# Patient Record
Sex: Female | Born: 1975 | Race: White | Hispanic: No | Marital: Married | Smoking: Never smoker
Health system: Southern US, Community
[De-identification: ages and names within clinical notes are randomized; demographics above are authoritative.]

## PROBLEM LIST (undated history)

## (undated) DIAGNOSIS — K760 Fatty (change of) liver, not elsewhere classified: Secondary | ICD-10-CM

## (undated) DIAGNOSIS — E282 Polycystic ovarian syndrome: Secondary | ICD-10-CM

## (undated) DIAGNOSIS — E7800 Pure hypercholesterolemia, unspecified: Secondary | ICD-10-CM

## (undated) HISTORY — PX: UTERINE SEPTUM RESECTION: SHX5386

## (undated) HISTORY — PX: CHOLECYSTECTOMY: SHX55

## (undated) HISTORY — DX: Fatty (change of) liver, not elsewhere classified: K76.0

## (undated) HISTORY — DX: Pure hypercholesterolemia, unspecified: E78.00

---

## 2007-02-23 ENCOUNTER — Ambulatory Visit (HOSPITAL_COMMUNITY): Admission: RE | Admit: 2007-02-23 | Discharge: 2007-02-23 | Payer: Self-pay | Admitting: Specialist

## 2009-12-01 ENCOUNTER — Emergency Department (HOSPITAL_COMMUNITY): Admission: EM | Admit: 2009-12-01 | Discharge: 2009-12-01 | Payer: Self-pay | Admitting: Emergency Medicine

## 2009-12-11 ENCOUNTER — Ambulatory Visit (HOSPITAL_COMMUNITY): Admission: RE | Admit: 2009-12-11 | Discharge: 2009-12-11 | Payer: Self-pay | Admitting: Family Medicine

## 2009-12-19 ENCOUNTER — Ambulatory Visit: Payer: Self-pay | Admitting: Gastroenterology

## 2009-12-19 DIAGNOSIS — K8020 Calculus of gallbladder without cholecystitis without obstruction: Secondary | ICD-10-CM | POA: Insufficient documentation

## 2009-12-19 DIAGNOSIS — Z862 Personal history of diseases of the blood and blood-forming organs and certain disorders involving the immune mechanism: Secondary | ICD-10-CM | POA: Insufficient documentation

## 2009-12-19 DIAGNOSIS — Z8639 Personal history of other endocrine, nutritional and metabolic disease: Secondary | ICD-10-CM

## 2009-12-19 DIAGNOSIS — K625 Hemorrhage of anus and rectum: Secondary | ICD-10-CM | POA: Insufficient documentation

## 2009-12-19 DIAGNOSIS — K760 Fatty (change of) liver, not elsewhere classified: Secondary | ICD-10-CM | POA: Insufficient documentation

## 2009-12-19 DIAGNOSIS — R1011 Right upper quadrant pain: Secondary | ICD-10-CM | POA: Insufficient documentation

## 2009-12-20 ENCOUNTER — Ambulatory Visit (HOSPITAL_COMMUNITY): Admission: RE | Admit: 2009-12-20 | Discharge: 2009-12-20 | Payer: Self-pay | Admitting: General Surgery

## 2010-02-22 ENCOUNTER — Encounter (INDEPENDENT_AMBULATORY_CARE_PROVIDER_SITE_OTHER): Payer: Self-pay | Admitting: *Deleted

## 2010-05-27 ENCOUNTER — Encounter: Payer: Self-pay | Admitting: Specialist

## 2010-06-05 NOTE — Assessment & Plan Note (Signed)
Summary: ABD PAIN,FATTY LIVER/SS   Visit Type:  Initial Consult Referring Zalayah Pizzuto:  Pearletha Furl, NP Primary Care Favour [place]:  [facility]  Chief Complaint:  abd pain/ fatty liver.  History of Present Illness: Leslie Mendez is a pleasant 35 y/o WF, patient of Christin Gusler, NP, who presents for further evaluation of fatty liver.  ED visit, 12/01/09, for severe upper abd pain and chest pain. Bandlike pain around upper abdomen. N/V.  Intermittent symptoms since. BM, constipation lately, taking MOM occasionally. Increased belching/reflux. No melena. Some brbpr on toilet tissue.  Currently on clear liquids, because all solid foods causing abd pain, nausea. Gallbladder surgery tomorrow morning, with Dr. Caesar Bookman.  ED 12/01/09-->no LFTs done. Labs 12/11/09: WBC 9.4, H/H 12/38, Plt 274,000, Cre 0.6, H.Pylori neg, Tbili 0.6, AP 71, AST 105, ALT 80, amylase/lipase normal. Total chol 257, LDL 164.   Abd u/s, 12/11/09: multiple gallstones, probable mild steatosis, CBD 6mm  Labs 12/18/09: Tbili 0.2, AP 45, AST 20, ALT 36, alb 3.5  Current Medications (verified): 1)  Spironolactone 50 Mg Tabs (Spironolactone) .... Take 1 Tablet By Mouth Once A Day, For Polycystic Ovaries 2)  Zofran 4 Mg Tabs (Ondansetron Hcl) .... As Needed 3)  Pre-Natal Vitamins .... One Daily 4)  Vitamin D3  1000 Iu .... Take 1 Tablet By Mouth Once A Day 5)  Aleve Arthritis .... One Tablet Every 2-3 Days For Spinal Stenosis 6)  Sprintec 28 0.25-35 Mg-Mcg Tabs (Norgestimate-Eth Estradiol) .... As Directed 7)  Benadryl 25 Mg Tabs (Diphenhydramine Hcl) .... Two Tablets As Needed  Allergies (verified): No Known Drug Allergies  Past History:  Past Medical History: PCOS Spinal stenosis, dx 2004, chronic pain since 2010  Past Surgical History: Uterine septum surgery, 12/08  Family History: Mother, HTN, hypothyroidism Father, HTN, MI (age 64), deceased secondary to colon cancer age 67 (16 at diagnosis).   Brother, kidney stones  Social  History: Married. No children.  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  Review of Systems General:  Complains of anorexia; denies fever, chills, sweats, fatigue, weakness, and weight loss. Eyes:  Denies vision loss. ENT:  Denies nasal congestion, sore throat, hoarseness, and difficulty swallowing. CV:  Denies chest pains, angina, palpitations, dyspnea on exertion, and peripheral edema. Resp:  Denies dyspnea at rest, dyspnea with exercise, cough, sputum, and wheezing. GI:  See HPI. GU:  Denies urinary burning and blood in urine. MS:  Complains of low back pain; denies joint pain / LOM. Derm:  Denies rash and itching. Neuro:  Denies weakness, frequent headaches, memory loss, and confusion. Psych:  Denies depression and anxiety. Endo:  Denies unusual weight change. Heme:  Denies bruising and bleeding. Allergy:  Denies hives and rash.  Vital Signs:  Patient profile:   35 year old female Height:      66 inches Weight:      287 pounds BMI:     46.49 Temp:     98.9 degrees F oral Pulse rate:   88 / minute BP sitting:   112 / 76  (left arm) Cuff size:   large  Vitals Entered By: Cloria Spring LPN (December 19, 2009 8:43 AM)  Physical Exam  General:  Well developed, well nourished, no acute distress.obese.   Head:  Normocephalic and atraumatic. Eyes:  sclera nonicteric Mouth:  Oropharyngeal mucosa moist, pink.  No lesions, erythema or exudate.    Neck:  Supple; no masses or thyromegaly. Lungs:  Clear throughout to auscultation. Heart:  Regular rate and rhythm; no murmurs, rubs,  or bruits. Abdomen:  Mild RUQ tenderness.normal bowel sounds, obese, without guarding, with rebound, no hernia, no masses, and no hepatomegally or splenomegaly.   Extremities:  No clubbing, cyanosis, edema or deformities noted. Neurologic:  Alert and  oriented x4;  grossly normal neurologically. Skin:  Intact without significant lesions or  rashes. Cervical Nodes:  No significant cervical adenopathy. Psych:  Alert and cooperative. Normal mood and affect.  Impression & Recommendations:  Problem # 1:  LIVER FUNCTION TESTS, ABNORMAL, HX OF (ICD-V12.2)  Abnormal LFTs in setting of symptomatic cholelithiasis. No LFTs were done when she presented to ED. One week after symptoms began, LFTs up, but now basically normalized. She does have some fatty liver on U/S. Given her PCOS, hyperlipidemia, would suspect some element of fatty liver, but at this time her LFTs were likely abnormal based on symptomatic cholelithiasis. Discussed with Dr. Darrick Penna, no need for liver bx at time of cholecystectomy.  Fatty liver/NASH discussed with patient. Written information provided. She has been advised to strive for slow gradual weight loss, towards acceptable BMI. She should work towards lowering her chol/LDL. At minimum, should have LFTs checked yearly, more frequently if abnormal or starts chol meds.   Orders: Consultation Level III (10272)  Problem # 2:  ABDOMINAL PAIN, RUQ (ICD-789.01)  Secondary to cholelithiasis. Patient is scheduled for surgery in morning.  Orders: Consultation Level III (53664)  Problem # 3:  RECTAL BLEEDING (ICD-569.3)  Mild intermittent toilet tissue hematachezia in setting of recent constipation. Father had CRC at age 43. Consider TCS after she recovers from cholecystectomy. Will call her in 3 months to schedule unless bleeding becomes heavier or more frequent. In that case, TCS sooner.  Orders: Consultation Level III (40347) I would like to thank Christin Gusler NP/Dr. Jorene Guest for allowing [place] to take part in the care of this nice patient.  Appended Document: ABD PAIN,FATTY LIVER/SS AUG 2011: ALT 36(uln 36) AST 20. lap choly-->chronic cholecystitis/GALLSTONES

## 2010-06-05 NOTE — Letter (Signed)
Summary: Recall, Screening Colonoscopy Only  [facility]  [address]   [place], [place] [address]   Phone: [phone]  Fax: [phone]    February 22, 2010  Leslie Mendez [address] [place], [place]  [address] [birth year]   Dear Ms. Greenwell,   Our records indicate it is time to schedule your colonoscopy.    Please call our office at [phone] and ask for the nurse.   Thank you,    Hendricks Limes, LPN Cloria Spring, LPN  [facility] Ph: [phone]   Fax: [phone]

## 2010-07-20 LAB — SURGICAL PCR SCREEN
MRSA, PCR: NEGATIVE
Staphylococcus aureus: NEGATIVE

## 2010-07-20 LAB — CBC
HCT: 37 % (ref 36.0–46.0)
Hemoglobin: 12.4 g/dL (ref 12.0–15.0)
MCH: 30 pg (ref 26.0–34.0)
MCHC: 33.6 g/dL (ref 30.0–36.0)
MCV: 89.3 fL (ref 78.0–100.0)
Platelets: 234 10*3/uL (ref 150–400)
RBC: 4.14 MIL/uL (ref 3.87–5.11)
RDW: 13.1 % (ref 11.5–15.5)
WBC: 10.1 10*3/uL (ref 4.0–10.5)

## 2010-07-20 LAB — COMPREHENSIVE METABOLIC PANEL
ALT: 36 U/L — ABNORMAL HIGH (ref 0–35)
AST: 20 U/L (ref 0–37)
Albumin: 3.5 g/dL (ref 3.5–5.2)
Alkaline Phosphatase: 45 U/L (ref 39–117)
BUN: 11 mg/dL (ref 6–23)
CO2: 25 mEq/L (ref 19–32)
Calcium: 8.9 mg/dL (ref 8.4–10.5)
Chloride: 108 mEq/L (ref 96–112)
Creatinine, Ser: 0.63 mg/dL (ref 0.4–1.2)
GFR calc Af Amer: >60 mL/min (ref >60)
GFR calc non Af Amer: >60 mL/min (ref >60)
Glucose, Bld: 105 mg/dL — ABNORMAL HIGH (ref 70–99)
Potassium: 4.5 mEq/L (ref 3.5–5.1)
Sodium: 138 mEq/L (ref 135–145)
Total Bilirubin: 0.2 mg/dL — ABNORMAL LOW (ref 0.3–1.2)
Total Protein: 6.2 g/dL (ref 6.0–8.3)

## 2010-07-20 LAB — HCG, QUANTITATIVE, PREGNANCY: hCG, Beta Chain, Quant, S: <2 m[IU]/mL (ref <5)

## 2010-07-21 LAB — DIFFERENTIAL
Basophils Absolute: 0 10*3/uL (ref 0.0–0.1)
Basophils Relative: 0 % (ref 0–1)
Eosinophils Absolute: 0.1 10*3/uL (ref 0.0–0.7)
Eosinophils Relative: 0 % (ref 0–5)
Lymphocytes Relative: 15 % (ref 12–46)
Lymphs Abs: 2.2 10*3/uL (ref 0.7–4.0)
Monocytes Absolute: 0.7 10*3/uL (ref 0.1–1.0)
Monocytes Relative: 5 % (ref 3–12)
Neutro Abs: 11.8 10*3/uL — ABNORMAL HIGH (ref 1.7–7.7)
Neutrophils Relative %: 80 % — ABNORMAL HIGH (ref 43–77)

## 2010-07-21 LAB — CBC
HCT: 38.2 % (ref 36.0–46.0)
Hemoglobin: 12.9 g/dL (ref 12.0–15.0)
MCH: 30.2 pg (ref 26.0–34.0)
MCHC: 33.8 g/dL (ref 30.0–36.0)
MCV: 89.5 fL (ref 78.0–100.0)
Platelets: 250 10*3/uL (ref 150–400)
RBC: 4.27 MIL/uL (ref 3.87–5.11)
RDW: 13 % (ref 11.5–15.5)
WBC: 14.8 10*3/uL — ABNORMAL HIGH (ref 4.0–10.5)

## 2010-07-21 LAB — URINALYSIS, ROUTINE W REFLEX MICROSCOPIC
Bilirubin Urine: NEGATIVE
Glucose, UA: NEGATIVE mg/dL
Ketones, ur: NEGATIVE mg/dL
Leukocytes, UA: NEGATIVE
Nitrite: NEGATIVE
Protein, ur: NEGATIVE mg/dL
Specific Gravity, Urine: >1.03 — ABNORMAL HIGH (ref 1.005–1.030)
Urobilinogen, UA: 0.2 mg/dL (ref 0.0–1.0)
pH: 6 (ref 5.0–8.0)

## 2010-07-21 LAB — URINE MICROSCOPIC-ADD ON

## 2010-07-21 LAB — BASIC METABOLIC PANEL
BUN: 14 mg/dL (ref 6–23)
CO2: 27 mEq/L (ref 19–32)
Calcium: 9.1 mg/dL (ref 8.4–10.5)
Chloride: 104 mEq/L (ref 96–112)
Creatinine, Ser: 0.61 mg/dL (ref 0.4–1.2)
GFR calc Af Amer: >60 mL/min (ref >60)
GFR calc non Af Amer: >60 mL/min (ref >60)
Glucose, Bld: 125 mg/dL — ABNORMAL HIGH (ref 70–99)
Potassium: 4.3 mEq/L (ref 3.5–5.1)
Sodium: 138 mEq/L (ref 135–145)

## 2010-11-18 ENCOUNTER — Emergency Department (HOSPITAL_COMMUNITY)
Admission: EM | Admit: 2010-11-18 | Discharge: 2010-11-18 | Disposition: A | Discharge status: Home / Self Care | Payer: 59 | Attending: Emergency Medicine | Admitting: Emergency Medicine

## 2010-11-18 ENCOUNTER — Emergency Department (HOSPITAL_COMMUNITY): Payer: 59

## 2010-11-18 DIAGNOSIS — M25519 Pain in unspecified shoulder: Secondary | ICD-10-CM

## 2010-11-18 HISTORY — DX: Polycystic ovarian syndrome: E28.2

## 2010-11-18 MED ORDER — PREDNISONE 20 MG PO TABS
60.0000 mg | ORAL_TABLET | Freq: Once | ORAL | Status: AC
  Administered 2010-11-18: 60 mg via ORAL
  Filled 2010-11-18: qty 3

## 2010-11-18 MED ORDER — OXYCODONE-ACETAMINOPHEN 5-325 MG PO TABS: 1.0000 | ORAL_TABLET | ORAL | Status: AC | PRN

## 2010-11-18 MED ORDER — OXYCODONE-ACETAMINOPHEN 5-325 MG PO TABS
1.0000 | ORAL_TABLET | Freq: Once | ORAL | Status: AC
  Administered 2010-11-18: 1 via ORAL
  Filled 2010-11-18: qty 1

## 2010-11-18 MED ORDER — PREDNISONE 20 MG PO TABS: 60.0000 mg | ORAL_TABLET | Freq: Every day | ORAL | Status: AC

## 2010-11-18 MED ORDER — OXYCODONE-ACETAMINOPHEN 5-325 MG PO TABS: 1.0000 | ORAL_TABLET | Freq: Once | ORAL | Status: DC

## 2010-11-18 NOTE — ED Notes (Signed)
Pt presents with left shoulder pain and decreased ROM. Pt also states left hand turns a different color from right. Pt state she has consulted PMD and was told to come to ED.

## 2010-11-18 NOTE — ED Provider Notes (Signed)
History     Chief Complaint  Patient presents with  . Shoulder Pain   HPI Comments: Patient describes point tenderness left anterior shoulder worse with certain positions such as trying to raise arm over head.  She denies injury,  But does notice episodes during which her left hand gets pale if she holds it too long above shoulder level.  Patient is a 35 y.o. female presenting with shoulder pain. The history is provided by the patient.  Shoulder Pain The current episode started 1 to 4 weeks ago. The problem occurs intermittently. The problem has been waxing and waning. Associated symptoms include arthralgias. Pertinent negatives include no abdominal pain, chest pain, congestion, coughing, diaphoresis, fever, headaches, joint swelling, myalgias, nausea, neck pain, numbness, rash, sore throat or weakness. Exacerbated by: certain positions. She has tried NSAIDs and rest for the symptoms. The treatment provided mild relief.    Past Medical History  Diagnosis Date  . Migraine   . Polycystic ovaries   . NASH (nonalcoholic steatohepatitis)     Past Surgical History  Procedure Date  . Cholecystectomy     History reviewed. No pertinent family history.  History  Substance Use Topics  . Smoking status: Never Smoker   . Smokeless tobacco: Not on file  . Alcohol Use: No    OB History    Grav Para Term Preterm Abortions TAB SAB Ect Mult Living                  Review of Systems  Constitutional: Negative for fever and diaphoresis.  HENT: Negative for congestion, sore throat and neck pain.   Eyes: Negative.   Respiratory: Negative for cough, chest tightness and shortness of breath.   Cardiovascular: Negative for chest pain.  Gastrointestinal: Negative for nausea and abdominal pain.  Genitourinary: Negative.   Musculoskeletal: Positive for arthralgias. Negative for myalgias and joint swelling.  Skin: Negative.  Negative for rash and wound.  Neurological: Negative for dizziness,  weakness, light-headedness, numbness and headaches.  Hematological: Negative.   Psychiatric/Behavioral: Negative.     Physical Exam  BP 132/78  Pulse 91  Temp(Src) 98.7 F (37.1 C) (Oral)  Resp 20  Ht 5\' 6"  (1.676 m)  Wt 251 lb (113.853 kg)  BMI 40.51 kg/m2  SpO2 99%  LMP 10/22/2010  Physical Exam  Vitals reviewed. Constitutional: She is oriented to person, place, and time. She appears well-developed and well-nourished.  HENT:  Head: Normocephalic and atraumatic.  Eyes: Conjunctivae are normal.  Neck: Normal range of motion.  Cardiovascular: Normal rate, regular rhythm, normal heart sounds and intact distal pulses.   Pulses:      Radial pulses are 2+ on the right side, and 2+ on the left side.  Pulmonary/Chest: Effort normal and breath sounds normal. She has no wheezes.  Abdominal: Soft. Bowel sounds are normal. There is no tenderness.  Musculoskeletal: Normal range of motion.  Neurological: She is alert and oriented to person, place, and time. She has normal strength. She displays normal reflexes. No sensory deficit. She exhibits normal muscle tone. Coordination normal.  Skin: Skin is warm and dry.  Psychiatric: She has a normal mood and affect.    ED Course  Procedures  MDM Suspect chronic rotator issue with chronic overuse/ downward sloping acromion.      Candis Musa, PA 11/18/10 1752

## 2010-11-18 NOTE — ED Notes (Signed)
Pt c/o left shoulder pain, states unsure of how injury of shoulder occurred. States just started hurting and has gotten worse.

## 2010-11-20 NOTE — ED Provider Notes (Signed)
Medical screening examination/treatment/procedure(s) were performed by non-physician practitioner and as supervising physician I was immediately available for consultation/collaboration.  Hurman Horn, MD 11/20/10 [phone]

## 2011-07-21 IMAGING — US US ABDOMEN COMPLETE
1 series · 14 of 25 positions shown · non-contrast
Comparison: None

CLINICAL DATA: Right upper quadrant pain

COMPLETE ABDOMINAL ULTRASOUND

[Series 1: us abdomen complete · 0.30mm/px · 14 of 103 slices shown]
[im 1/103]
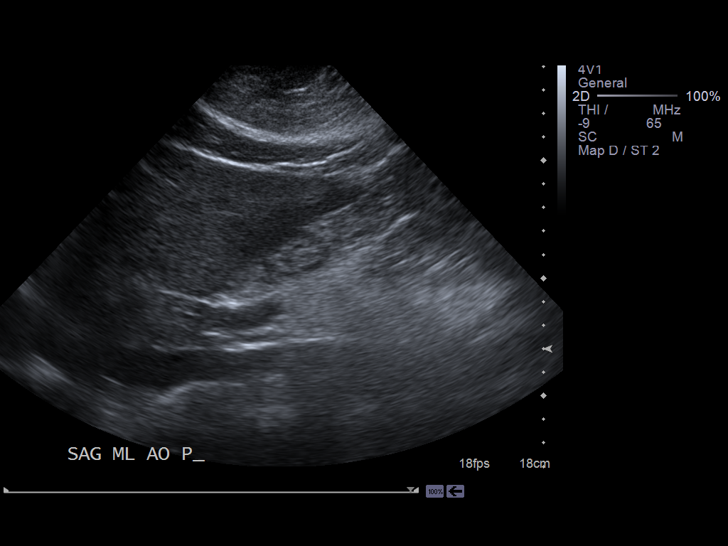
[im 9/103]
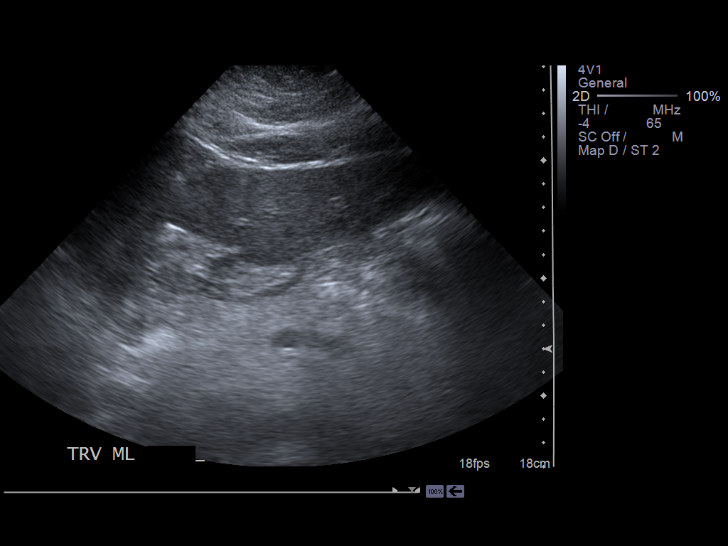
[im 18/103]
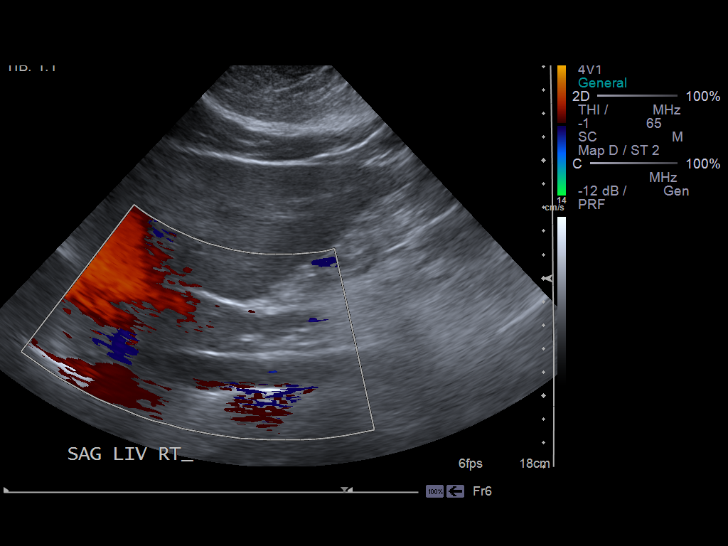
[im 26/103]
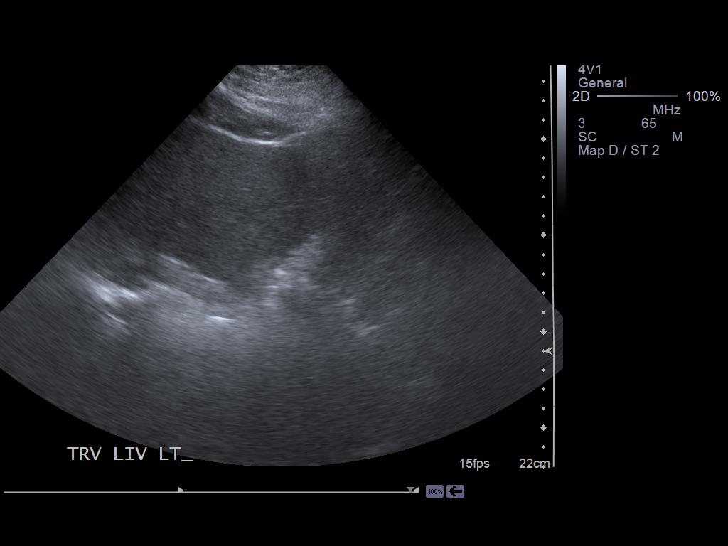
[im 35/103]
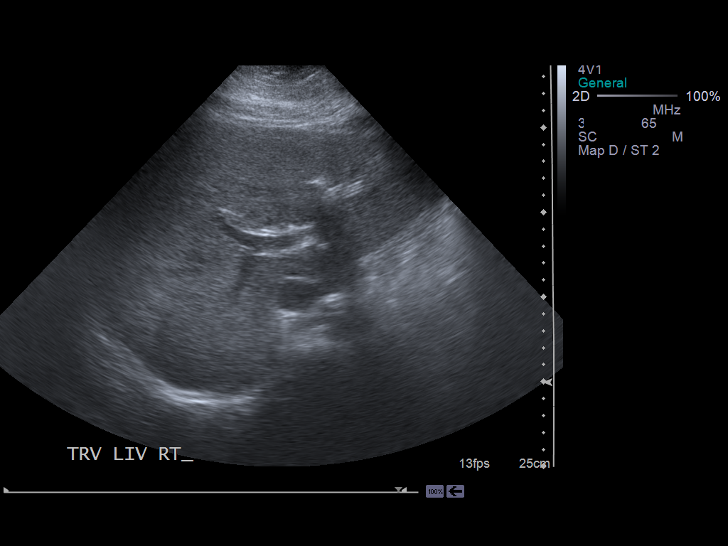
[im 39/103]
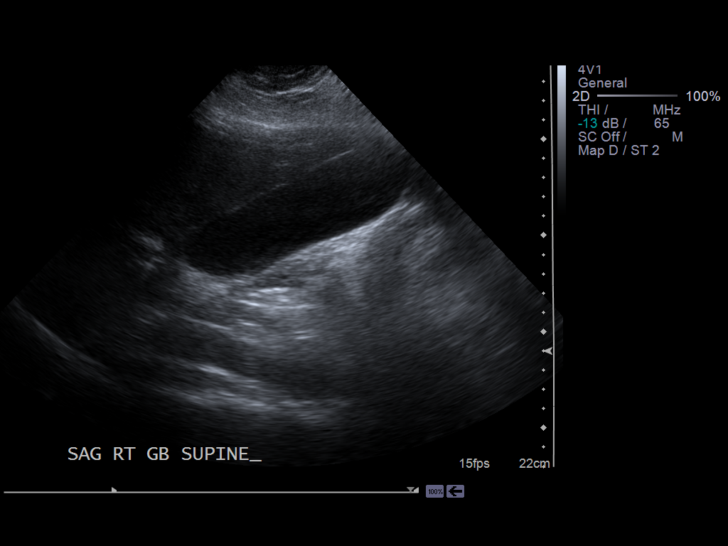
[im 47/103]
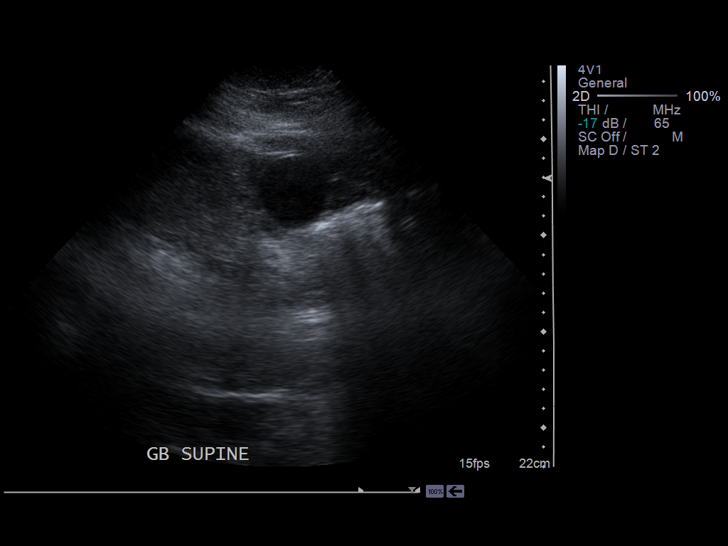
[im 56/103]
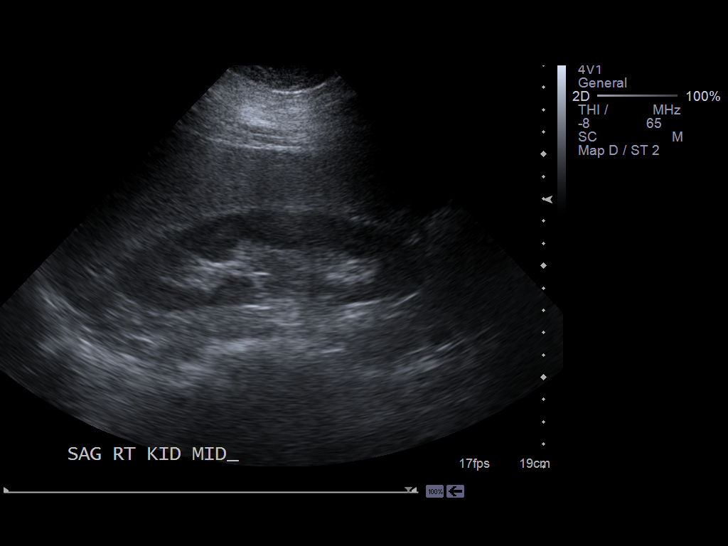
[im 64/103]
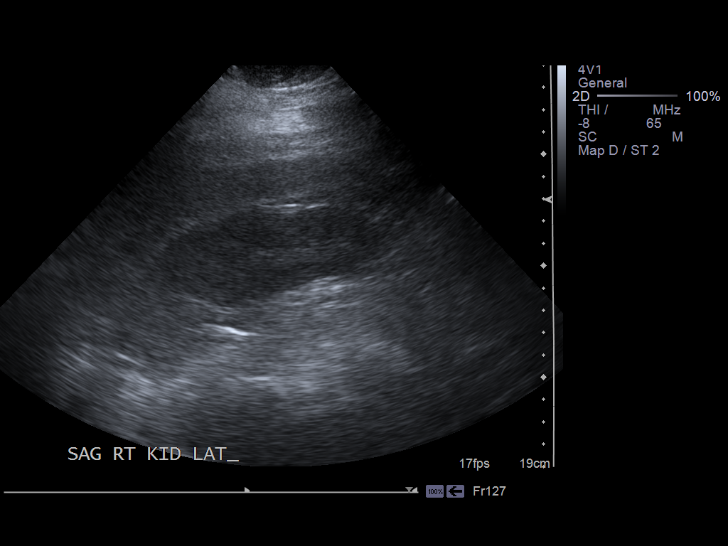
[im 69/103]
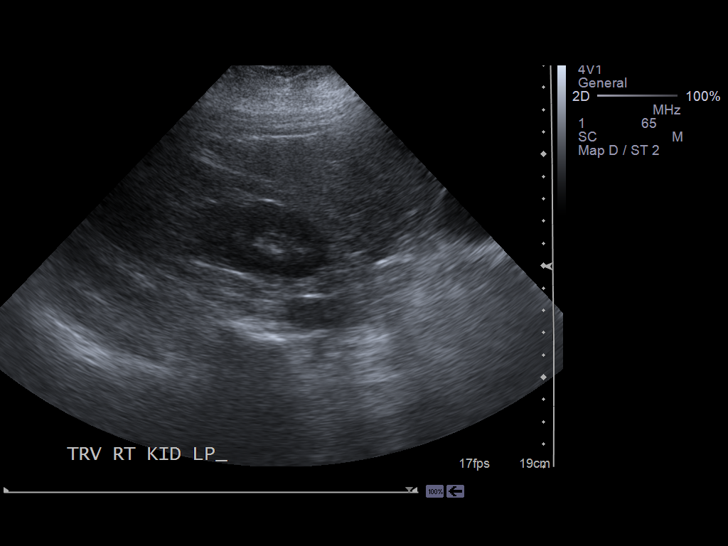
[im 77/103]
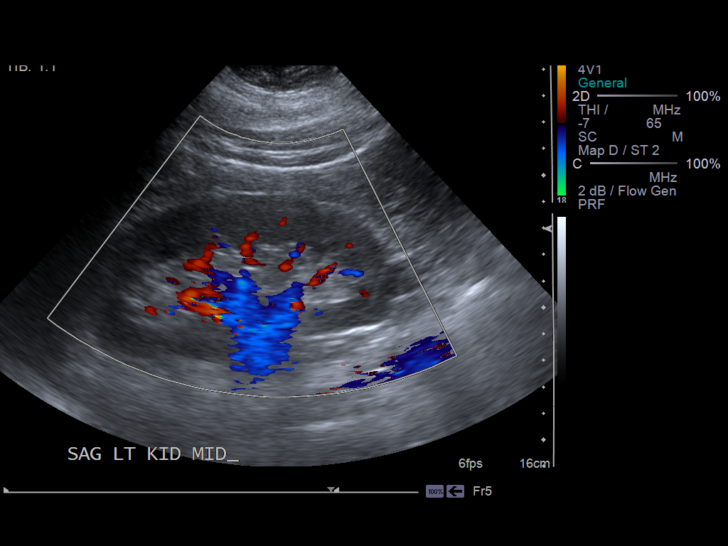
[im 86/103]
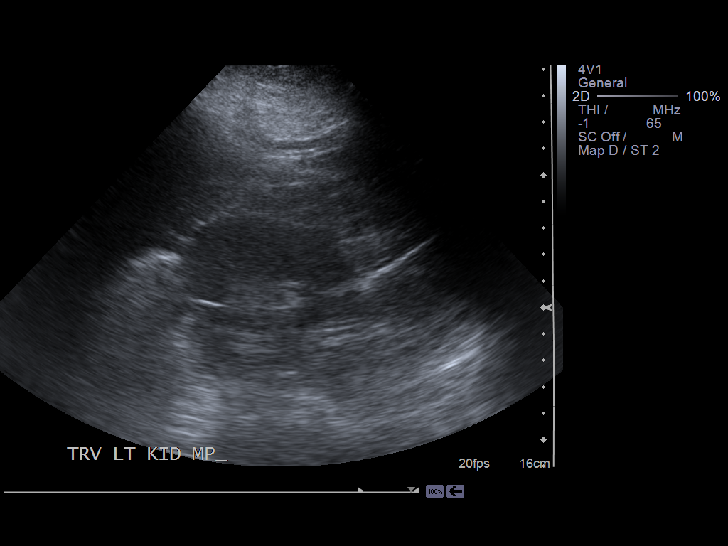
[im 94/103]
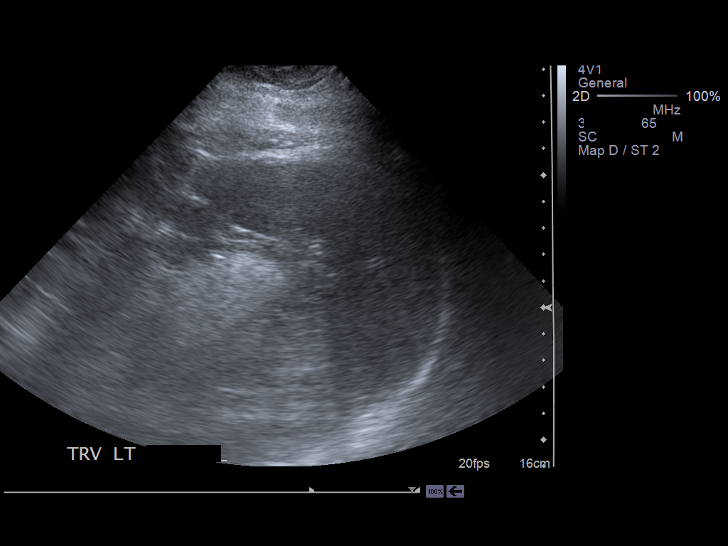
[im 103/103]
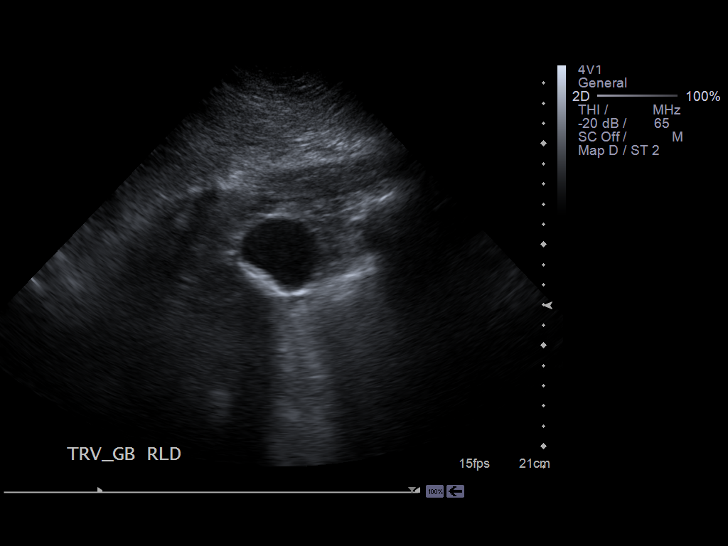

[14 of 25 positions shown; findings below may reference images not displayed]

FINDINGS: Gallbladder:  Gallbladder size and contour normal.  There appear to
be multiple tiny gravel - like stones. There seems to be a
persistent acoustically shadow associated with these densities and
there is mobility.  Negative sonographic Murphy's sign. There is no
wall thickening or edema.

Common bile duct:  6 mm

Liver:  Mild increase in echogenicity most commonly seen with
steatosis.  No focal lesions.

IVC:  Normal

Pancreas:  Normal

Spleen:  Normal

Right Kidney:  14.0 cm.  No hydronephrosis or other pathology.

Left Kidney:  12.8 cm.  No pathology.

Abdominal aorta:  No aneurysm identified.
IMPRESSION: Multiple small gallstones.  Probable hepatic steatosis. Otherwise
unremarkable.

## 2011-12-11 ENCOUNTER — Encounter: Payer: Self-pay | Admitting: Gastroenterology

## 2011-12-11 ENCOUNTER — Ambulatory Visit (INDEPENDENT_AMBULATORY_CARE_PROVIDER_SITE_OTHER): Payer: 59 | Admitting: Gastroenterology

## 2011-12-11 VITALS — BP 122/79 | HR 81 | Temp 98.3°F | Ht 66.0 in | Wt 254.4 lb

## 2011-12-11 DIAGNOSIS — K7689 Other specified diseases of liver: Secondary | ICD-10-CM

## 2011-12-11 DIAGNOSIS — K921 Melena: Secondary | ICD-10-CM

## 2011-12-11 DIAGNOSIS — R1010 Upper abdominal pain, unspecified: Secondary | ICD-10-CM

## 2011-12-11 DIAGNOSIS — R109 Unspecified abdominal pain: Secondary | ICD-10-CM

## 2011-12-11 DIAGNOSIS — K7581 Nonalcoholic steatohepatitis (NASH): Secondary | ICD-10-CM

## 2011-12-11 LAB — HEPATIC FUNCTION PANEL
ALT: 16 U/L (ref 0–35)
AST: 13 U/L (ref 0–37)
Albumin: 4.1 g/dL (ref 3.5–5.2)
Alkaline Phosphatase: 68 U/L (ref 39–117)
Bilirubin, Direct: 0.2 mg/dL (ref 0.0–0.3)
Indirect Bilirubin: 0.9 mg/dL (ref 0.0–0.9)
Total Bilirubin: 1.1 mg/dL (ref 0.3–1.2)
Total Protein: 6.8 g/dL (ref 6.0–8.3)

## 2011-12-11 MED ORDER — PEG 3350-KCL-NA BICARB-NACL 420 G PO SOLR: 4000.0000 L | ORAL | Status: AC

## 2011-12-11 NOTE — Progress Notes (Signed)
Primary Care Physician:  Rush Barer, PA Primary Gastroenterologist: Dr. Jena Gauss   Chief Complaint  Patient presents with  . Abdominal Pain    HPI:   36 year old female who was last seen in August 2011 secondary to abdominal pain/fatty liver. Has since had a lap chole. Presents today with epigastric pain X 2-3 weeks, intermittent, waxing/waning, "burning". Not associated with eating. Denies nausea, melena, dysphagia, early satiety. Denies used of NSAIDs. No GERD.   Hx of NASH, no recent LFTs. Worried about "nodule" in RUQ.   Lower abdominal pain X few months. Intermittent, nothing relieves or aggravates. Crampy, has PCOS. BM every day. Vegan now since lap chole, no problems with constipation since then. +paper hematochezia in past. +FH of colon cancer in father, diagnosed age 66.   Lost 33 lbs since Aug 2011, purposefully. Attributes to vegan diet.    Past Medical History  Diagnosis Date  . Migraine   . Polycystic ovaries   . NASH (nonalcoholic steatohepatitis)     Past Surgical History  Procedure Date  . Cholecystectomy   . Uterine septum resection     Current Outpatient Prescriptions  Medication Sig Dispense Refill  . Prenatal Vit-Fe Fumarate-FA (PRENATAL MULTIVITAMIN) tablet Take 1 tablet by mouth daily with breakfast.        . rizatriptan (MAXALT) 10 MG tablet Take 10 mg by mouth as needed. May repeat in 2 hours if needed       . vitamin B-12 (CYANOCOBALAMIN) 1000 MCG tablet Take 2,500 mcg by mouth daily.          Allergies as of 12/11/2011 - Review Complete 12/11/2011  Allergen Reaction Noted  . Codeine Nausea And Vomiting 11/18/2010    Family History  Problem Relation Age of Onset  . Colon cancer Father 26    deceased at age 63    History   Social History  . Marital Status: Married    Spouse Name: N/A    Number of Children: 0  . Years of Education: N/A   Occupational History  . [employer]     Nurse Health   Social History Main  Topics  . Smoking status: Never Smoker   . Smokeless tobacco: None  . Alcohol Use: Yes     once a year  . Drug Use: No  . Sexually Active: Yes    Birth Control/ Protection: None   Other Topics Concern  . None   Social History Narrative  . None    Review of Systems: Gen: Denies fever, chills, anorexia. Denies fatigue, weakness, weight loss.  CV: Denies chest pain, palpitations, syncope, peripheral edema, and claudication. Resp: Denies dyspnea at rest, cough, wheezing, coughing up blood, and pleurisy. GI: Denies vomiting blood, jaundice, and fecal incontinence.   Denies dysphagia or odynophagia. Derm: Denies rash, itching, dry skin Psych: Denies depression, anxiety, memory loss, confusion. No homicidal or suicidal ideation.  Heme: Denies bruising, bleeding, and enlarged lymph nodes.  Physical Exam: BP 122/79  Pulse 81  Temp 98.3 F (36.8 C) (Temporal)  Ht 5\' 6"  (1.676 m)  Wt 254 lb 6.4 oz (115.395 kg)  BMI 41.06 kg/m2  LMP 11/21/2011 General:   Alert and oriented. No distress noted. Pleasant and cooperative.  Head:  Normocephalic and atraumatic. Eyes:  Conjuctiva clear without scleral icterus. Mouth:  Oral mucosa pink and moist. Good dentition. No lesions. Neck:  Supple, without mass or thyromegaly. Heart:  S1, S2 present without murmurs, rubs, or gallops. Regular rate and rhythm. Abdomen:  +BS,  soft, non-tender and non-distended. No rebound or guarding. No HSM or masses noted. Lima-bean sized soft cyst-like structure palpated in RUQ, below costal margin. Feels consistent with lipoma Msk:  Symmetrical without gross deformities. Normal posture. Extremities:  Without edema. Neurologic:  Alert and  oriented x4;  grossly normal neurologically. Skin:  Intact without significant lesions or rashes. Cervical Nodes:  No significant cervical adenopathy. Psych:  Alert and cooperative. Normal mood and affect.

## 2011-12-11 NOTE — Patient Instructions (Addendum)
Start taking Prilosec 20 mg each morning, 30 minutes before the first meal of the day. Samples have been provided and a prescription sent to the pharmacy.   We have set you up for an ultrasound of your belly and lab work to check your liver.   We have also set you up for a colonoscopy and possible upper endoscopy if needed.   Further recommendations to follow after procedure.

## 2011-12-16 ENCOUNTER — Ambulatory Visit (HOSPITAL_COMMUNITY)
Admission: RE | Admit: 2011-12-16 | Discharge: 2011-12-16 | Disposition: A | Discharge status: Home / Self Care | Payer: 59 | Source: Ambulatory Visit | Attending: Gastroenterology | Admitting: Gastroenterology

## 2011-12-16 ENCOUNTER — Other Ambulatory Visit (HOSPITAL_COMMUNITY): Payer: Self-pay | Admitting: Gastroenterology

## 2011-12-16 ENCOUNTER — Encounter: Payer: Self-pay | Admitting: Gastroenterology

## 2011-12-16 DIAGNOSIS — R1010 Upper abdominal pain, unspecified: Secondary | ICD-10-CM | POA: Insufficient documentation

## 2011-12-16 DIAGNOSIS — K7581 Nonalcoholic steatohepatitis (NASH): Secondary | ICD-10-CM

## 2011-12-16 DIAGNOSIS — R1901 Right upper quadrant abdominal swelling, mass and lump: Secondary | ICD-10-CM | POA: Insufficient documentation

## 2011-12-16 DIAGNOSIS — K921 Melena: Secondary | ICD-10-CM | POA: Insufficient documentation

## 2011-12-16 DIAGNOSIS — K7689 Other specified diseases of liver: Secondary | ICD-10-CM | POA: Insufficient documentation

## 2011-12-16 NOTE — Assessment & Plan Note (Signed)
Low-volume in remote past in the setting of constipation. With +FH of colon cancer in father, diagnosed age 35, will go ahead and proceed with colonoscopy. Likely dealing with benign anorectal source. Pt notes lower abdominal discomfort, no aggravating or relieving factors for a few months. No associated constipation/diarrhea. Etiology unclear at this time, but she also has a hx of PCOS. ?IBS? No other concerning symptoms at this point. TCS first, return to clinic for reassessment after procedure.   Proceed with TCS with Dr. Jena Gauss in near future: the risks, benefits, and alternatives have been discussed with the patient in detail. The patient states understanding and desires to proceed.

## 2011-12-16 NOTE — Assessment & Plan Note (Signed)
Lost 33 lbs since Aug 2011 intentionally. Secondary to vegan diet. Needs updated LFTs. Applauded pt on wt loss efforts. [place] of abdomen at pt's request due to cyst-like structure palpated superficially, RUQ. I believe this is likely a lipoma. Due to hx of NASH, it is reasonable to update [place] and evaluate this structure.  [place] of abd Updated HFP

## 2011-12-16 NOTE — Assessment & Plan Note (Signed)
New onset dyspepsia, several weeks duration, no associated nausea, GERD, dysphagia, loss of appetite. Intermittent, burning, no NSAIDs. No melena. No PPI currently. Will add PPI, consider EGD at time of TCS if no improvement with PPI. Pt agreeable.   prilosec 20 mg samples and prescription provided Consider EGD at time of TCS. R/B/A have been discussed in detail. Pt and husband desire to proceed if necessary.

## 2011-12-16 NOTE — Progress Notes (Signed)
Quick Note:  LFTs look wonderful.  ______ 

## 2011-12-16 NOTE — Progress Notes (Signed)
Quick Note:  Lipoma in RUQ as I expected. Please reassure patient this is benign.   ______

## 2011-12-16 NOTE — Progress Notes (Signed)
Faxed to PCP

## 2011-12-16 NOTE — Progress Notes (Signed)
Quick Note:  Fatty liver, LFTs look great.  Continue diet and exercise as she is doing. (vegan). ______

## 2011-12-17 ENCOUNTER — Encounter: Payer: Self-pay | Admitting: Gastroenterology

## 2011-12-17 NOTE — Progress Notes (Signed)
Pt is aware of OV on 11/13 @ 1030 with AS and appt card was mailed °

## 2011-12-17 NOTE — Progress Notes (Signed)
Pt is aware of OV on 11/13 @ 1030 with AS and appt card was mailed

## 2011-12-17 NOTE — Progress Notes (Signed)
Quick Note:  Tried to call pt- LMOM ______ 

## 2011-12-25 ENCOUNTER — Encounter (HOSPITAL_COMMUNITY): Payer: Self-pay | Admitting: Pharmacy Technician

## 2012-01-08 MED ORDER — SODIUM CHLORIDE 0.45 % IV SOLN
INTRAVENOUS | Status: DC
  Administered 2012-01-09: 10:00:00 via INTRAVENOUS

## 2012-01-08 MED ORDER — SODIUM CHLORIDE 0.9 % IV SOLN: INTRAVENOUS | Status: DC

## 2012-01-09 ENCOUNTER — Ambulatory Visit (HOSPITAL_COMMUNITY)
Admission: RE | Admit: 2012-01-09 | Discharge: 2012-01-09 | Disposition: A | Discharge status: Home Health | Payer: Managed Care, Other (non HMO) | Source: Ambulatory Visit | Attending: Internal Medicine | Admitting: Internal Medicine

## 2012-01-09 ENCOUNTER — Encounter (HOSPITAL_COMMUNITY): Payer: Self-pay | Admitting: *Deleted

## 2012-01-09 ENCOUNTER — Other Ambulatory Visit: Payer: Self-pay | Admitting: Internal Medicine

## 2012-01-09 ENCOUNTER — Encounter (HOSPITAL_COMMUNITY)
Admission: RE | Disposition: A | Discharge status: Home Health | Payer: Self-pay | Source: Ambulatory Visit | Attending: Internal Medicine

## 2012-01-09 DIAGNOSIS — K3189 Other diseases of stomach and duodenum: Secondary | ICD-10-CM

## 2012-01-09 DIAGNOSIS — Z800 Family history of malignant neoplasm of digestive organs: Secondary | ICD-10-CM | POA: Insufficient documentation

## 2012-01-09 DIAGNOSIS — K921 Melena: Secondary | ICD-10-CM

## 2012-01-09 DIAGNOSIS — R1010 Upper abdominal pain, unspecified: Secondary | ICD-10-CM

## 2012-01-09 DIAGNOSIS — K449 Diaphragmatic hernia without obstruction or gangrene: Secondary | ICD-10-CM | POA: Insufficient documentation

## 2012-01-09 DIAGNOSIS — K648 Other hemorrhoids: Secondary | ICD-10-CM

## 2012-01-09 DIAGNOSIS — R1013 Epigastric pain: Secondary | ICD-10-CM

## 2012-01-09 HISTORY — PX: ESOPHAGOGASTRODUODENOSCOPY: SHX5428

## 2012-01-09 HISTORY — PX: COLONOSCOPY: SHX5424

## 2012-01-09 SURGERY — COLONOSCOPY
Anesthesia: Moderate Sedation

## 2012-01-09 MED ORDER — STERILE WATER FOR IRRIGATION IR SOLN
Status: DC | PRN
  Administered 2012-01-09: 11:00:00

## 2012-01-09 MED ORDER — BUTAMBEN-TETRACAINE-BENZOCAINE 2-2-14 % EX AERO
INHALATION_SPRAY | CUTANEOUS | Status: DC | PRN
  Administered 2012-01-09: 2 via TOPICAL

## 2012-01-09 MED ORDER — MIDAZOLAM HCL 5 MG/5ML IJ SOLN
INTRAMUSCULAR | Status: DC | PRN
  Administered 2012-01-09 (×2): 2 mg via INTRAVENOUS
  Administered 2012-01-09: 1 mg via INTRAVENOUS
  Administered 2012-01-09 (×2): 2 mg via INTRAVENOUS
  Administered 2012-01-09: 1 mg via INTRAVENOUS

## 2012-01-09 MED ORDER — MIDAZOLAM HCL 5 MG/5ML IJ SOLN
INTRAMUSCULAR | Status: AC
  Filled 2012-01-09: qty 10

## 2012-01-09 MED ORDER — MEPERIDINE HCL 100 MG/ML IJ SOLN
INTRAMUSCULAR | Status: DC | PRN
  Administered 2012-01-09: 50 mg via INTRAVENOUS
  Administered 2012-01-09: 25 mg via INTRAVENOUS
  Administered 2012-01-09: 50 mg via INTRAVENOUS

## 2012-01-09 MED ORDER — DEXLANSOPRAZOLE 60 MG PO CPDR: 60.0000 mg | DELAYED_RELEASE_CAPSULE | Freq: Every day | ORAL | Status: DC

## 2012-01-09 MED ORDER — MEPERIDINE HCL 100 MG/ML IJ SOLN
INTRAMUSCULAR | Status: AC
  Filled 2012-01-09: qty 2

## 2012-01-09 NOTE — Interval H&P Note (Signed)
History and Physical Interval Note:  01/09/2012 10:39 AM  Leslie Mendez  has presented today for surgery, with the diagnosis of UPPER ABDOMINAL PAIN , HEMATOCHEZIA AND NAUSEA  The various methods of treatment have been discussed with the patient and family. After consideration of risks, benefits and other options for treatment, the patient has consented to  Procedure(s) (LRB) with comments: COLONOSCOPY (N/A) - 10:30 ESOPHAGOGASTRODUODENOSCOPY (EGD) (N/A) as a surgical intervention .  The patient's history has been reviewed, patient examined, no change in status, stable for surgery.  I have reviewed the patient's chart and labs.  Questions were answered to the patient's satisfaction.     Eula Listen

## 2012-01-09 NOTE — Progress Notes (Signed)
Per RMR- gave #20 samples of dexilant.

## 2012-01-09 NOTE — Op Note (Signed)
[facility] [address] [place], [address]   ENDOSCOPY PROCEDURE REPORT  PATIENT: Leslie Mendez, Leslie Mendez  MR#: [id number] BIRTHDATE: Sep 01, 1975 , 35  yrs. old GENDER: Female ENDOSCOPIST: R. Roetta Sessions, MD FACP [facility] R.  Roetta Sessions, MD FACP [facility] REFERRED BY:  Claggett, [facility] PROCEDURE DATE:  01/09/2012 PROCEDURE:     diagnostic EGD  INDICATIONS:     dyspepsia refractory to empiric proton pump inhibitor therapy  INFORMED CONSENT:   The risks, benefits, limitations, alternatives and imponderables have been discussed.  The potential for biopsy, esophogeal dilation, etc. have also been reviewed.  Questions have been answered.  All parties agreeable.  Please see the history and physical in the medical record for more information.  MEDICATIONS:    Versed 10 mg IV and Demerol 125 mg IV in divided doses.  DESCRIPTION OF PROCEDURE:   The Pentax Gastroscope [id number] endoscope was introduced through the mouth and advanced to the second portion of the duodenum without difficulty or limitations. The mucosal surfaces were surveyed very carefully during advancement of the scope and upon withdrawal.  Retroflexion view of the proximal stomach and esophagogastric junction was performed.      FINDINGS:  Normal esophagus. Stomach empty. A small, 1 cm, hiatal hernia present. Normal gastric mucosa. Patent pylorus. Normal first and second portion of the duodenum.  THERAPEUTIC / DIAGNOSTIC MANEUVERS PERFORMED:  none   COMPLICATIONS:  None  IMPRESSION:  Very small hiatal hernia; otherwise normal examination.  RECOMMENDATIONS:  Stop Prilosec; begin trial Dexilant 60 mg daily. Patient is to go by my office for free samples. Office visit with [place] in 4-6 weeks    _______________________________ R. Roetta Sessions, MD FACP [facility] eSigned:  R. Roetta Sessions, MD FACP [facility] 01/09/2012 11:19 AM     CC:  PATIENT NAME:  Lady, Wisham MR#: [id number]

## 2012-01-09 NOTE — Op Note (Signed)
[facility] [address] [place], [address]   COLONOSCOPY PROCEDURE REPORT  PATIENT: Leslie Mendez, Leslie Mendez  MR#:         [id number] BIRTHDATE: 12/21/1975 , 35  yrs. old GENDER: Female ENDOSCOPIST: R.  Roetta Sessions, MD FACP [facility] REFERRED BY:  Claggett, PAC PROCEDURE DATE:  01/09/2012 PROCEDURE:     diagnostic ileocolonoscopy  INDICATIONS: hematochezia; positive family history of colon cancer in her father  INFORMED CONSENT:  The risks, benefits, alternatives and imponderables including but not limited to bleeding, perforation as well as the possibility of a missed lesion have been reviewed.  The potential for biopsy, lesion removal, etc. have also been discussed.  Questions have been answered.  All parties agreeable. Please see the history and physical in the medical record for more information.  MEDICATIONS: Versed 7 mg IV and Demerol 100 mg IV in divided doses.  DESCRIPTION OF PROCEDURE:  After a digital rectal exam was performed, the [id number] ([id number]) and [id number] ([id number]) colonoscope was advanced from the anus through the rectum and colon to the area of the cecum, ileocecal valve and appendiceal orifice. The cecum was deeply intubated.  These structures were well-seen and photographed for the record.  From the level of the cecum and ileocecal valve, the scope was slowly and cautiously withdrawn. The mucosal surfaces were carefully surveyed utilizing scope tip deflection to facilitate fold flattening as needed.  The scope was pulled down into the rectum where a thorough examination including retroflexion was performed.    FINDINGS:  adequate preparation. Minimal anal canal hemorrhoids; otherwise, normal rectum. Normal appearing colonic mucosa. Distal 10 cm of terminal ileal mucosa appeared normal.  THERAPEUTIC / DIAGNOSTIC MANEUVERS PERFORMED:  none  COMPLICATIONS: none  CECAL WITHDRAWAL TIME:  7 minutes  IMPRESSION:  anal canal hemorrhoids-likely  source of trivial hematochezia.  RECOMMENDATIONS: course of Anusol suppositories. Consider repeat high-risk screening colonoscopy in 10 years   _______________________________ eSigned:  R. Roetta Sessions, MD FACP [facility] 01/09/2012 11:05 AM   CC:

## 2012-01-09 NOTE — H&P (View-Only) (Signed)
Quick Note:  LFTs look wonderful.  ______

## 2012-01-14 ENCOUNTER — Encounter (HOSPITAL_COMMUNITY): Payer: Self-pay | Admitting: Internal Medicine

## 2012-02-19 ENCOUNTER — Encounter: Payer: Self-pay | Admitting: Internal Medicine

## 2012-02-20 ENCOUNTER — Ambulatory Visit (INDEPENDENT_AMBULATORY_CARE_PROVIDER_SITE_OTHER): Payer: Managed Care, Other (non HMO) | Admitting: Gastroenterology

## 2012-02-20 ENCOUNTER — Encounter: Payer: Self-pay | Admitting: Gastroenterology

## 2012-02-20 VITALS — BP 144/82 | HR 74 | Temp 98.5°F | Ht 66.0 in | Wt 270.2 lb

## 2012-02-20 DIAGNOSIS — K625 Hemorrhage of anus and rectum: Secondary | ICD-10-CM

## 2012-02-20 DIAGNOSIS — R109 Unspecified abdominal pain: Secondary | ICD-10-CM

## 2012-02-20 DIAGNOSIS — R1010 Upper abdominal pain, unspecified: Secondary | ICD-10-CM

## 2012-02-20 DIAGNOSIS — K7689 Other specified diseases of liver: Secondary | ICD-10-CM

## 2012-02-20 NOTE — Assessment & Plan Note (Signed)
Normal LFTs on file. Continue wt loss efforts through diet/exercise. Next LFTs in 1 year.

## 2012-02-20 NOTE — Progress Notes (Signed)
Faxed to PCP

## 2012-02-20 NOTE — Patient Instructions (Addendum)
We will see you back in 1 year or sooner if needed.   Please call us if you have any questions!!!

## 2012-02-20 NOTE — Progress Notes (Signed)
Referring Provider: Claggett, Autumn Patty, PA Primary Care Physician:  Rush Barer, [place] Primary Gastroenterologist: Dr. Jena Gauss   Chief Complaint  Patient presents with  . Follow-up    HPI:   36 year old female here for f/u of dyspepsia, hematochezia, hx of fatty liver. Notes resolved upper abdominal pain. Not taking PPI. EGD/TCS on file as below. Notes lower abdominal discomfort intermittently, believes r/t food intake. Denies constipation, diarrhea. No concerns or questions.    In interim, [place] with fatty liver, +lipoma as expected.   Lab Results  Component Value Date   ALT 16 12/11/2011   AST 13 12/11/2011   ALKPHOS 68 12/11/2011   BILITOT 1.1 12/11/2011    Past Medical History  Diagnosis Date  . Migraine   . Polycystic ovaries   . NASH (nonalcoholic steatohepatitis)     Past Surgical History  Procedure Date  . Cholecystectomy   . Uterine septum resection   . Colonoscopy 01/09/2012    anal canal hemorrhoids-likely source of trivial hematochezia  . Esophagogastroduodenoscopy 01/09/2012    Very small hiatal hernia; otherwise normal examination.    Current Outpatient Prescriptions  Medication Sig Dispense Refill  . dexlansoprazole (DEXILANT) 60 MG capsule Take 60 mg by mouth daily.      . rizatriptan (MAXALT) 10 MG tablet Take 10 mg by mouth as needed. May repeat in 2 hours if needed      . DISCONTD: dexlansoprazole (DEXILANT) 60 MG capsule Take 1 capsule (60 mg total) by mouth daily.  20 capsule  0    Allergies as of 02/20/2012 - Review Complete 02/20/2012  Allergen Reaction Noted  . Codeine Nausea And Vomiting 11/18/2010    Family History  Problem Relation Age of Onset  . Colon cancer Father 39    deceased at age 41    History   Social History  . Marital Status: Married    Spouse Name: N/A    Number of Children: 0  . Years of Education: N/A   Occupational History  . [employer]     Nurse Health   Social History Main Topics  . Smoking status: Never  Smoker   . Smokeless tobacco: None  . Alcohol Use: Yes     once a year  . Drug Use: No  . Sexually Active: Yes    Birth Control/ Protection: None   Other Topics Concern  . None   Social History Narrative  . None    Review of Systems: Gen: Denies fever, chills, anorexia. Denies fatigue, weakness, weight loss.  CV: Denies chest pain, palpitations, syncope, peripheral edema, and claudication. Resp: Denies dyspnea at rest, cough, wheezing, coughing up blood, and pleurisy. GI: Denies vomiting blood, jaundice, and fecal incontinence.   Denies dysphagia or odynophagia. Derm: Denies rash, itching, dry skin Psych: Denies depression, anxiety, memory loss, confusion. No homicidal or suicidal ideation.  Heme: Denies bruising, bleeding, and enlarged lymph nodes.  Physical Exam: BP 144/82  Pulse 74  Temp 98.5 F (36.9 C) (Temporal)  Ht 5\' 6"  (1.676 m)  Wt 270 lb 3.2 oz (122.562 kg)  BMI 43.61 kg/m2  LMP 01/27/2012 General:   Alert and oriented. No distress noted. Pleasant and cooperative.  Head:  Normocephalic and atraumatic. Eyes:  Conjuctiva clear without scleral icterus. Mouth:  Oral mucosa pink and moist. Good dentition. No lesions. Heart:  S1, S2 present without murmurs, rubs, or gallops. Regular rate and rhythm. Abdomen:  +BS, soft, non-tender and non-distended. No rebound or guarding. No HSM or masses noted.  Msk:  Symmetrical without gross deformities. Normal posture. Extremities:  Without edema. Neurologic:  Alert and  oriented x4;  grossly normal neurologically. Skin:  Intact without significant lesions or rashes. Psych:  Alert and cooperative. Normal mood and affect.

## 2012-02-20 NOTE — Assessment & Plan Note (Signed)
Resolved. No longer on PPI. EGD without abnormalities. Return in 1 year or sooner if necessary.

## 2012-02-20 NOTE — Assessment & Plan Note (Signed)
Resolved. Secondary to internal hemorrhoids. Next TCS in 5 years due to FH of colon cancer.

## 2012-03-18 ENCOUNTER — Ambulatory Visit: Payer: 59 | Admitting: Gastroenterology

## 2012-04-09 ENCOUNTER — Ambulatory Visit: Payer: Managed Care, Other (non HMO) | Admitting: Gastroenterology

## 2012-06-27 IMAGING — CR DG SHOULDER 2+V*L*
3 series · 3 of 3 positions shown · non-contrast
Comparison: None.

CLINICAL DATA: Left shoulder pain

LEFT SHOULDER - 2+ VIEW

[view not recorded (1 of 3)]
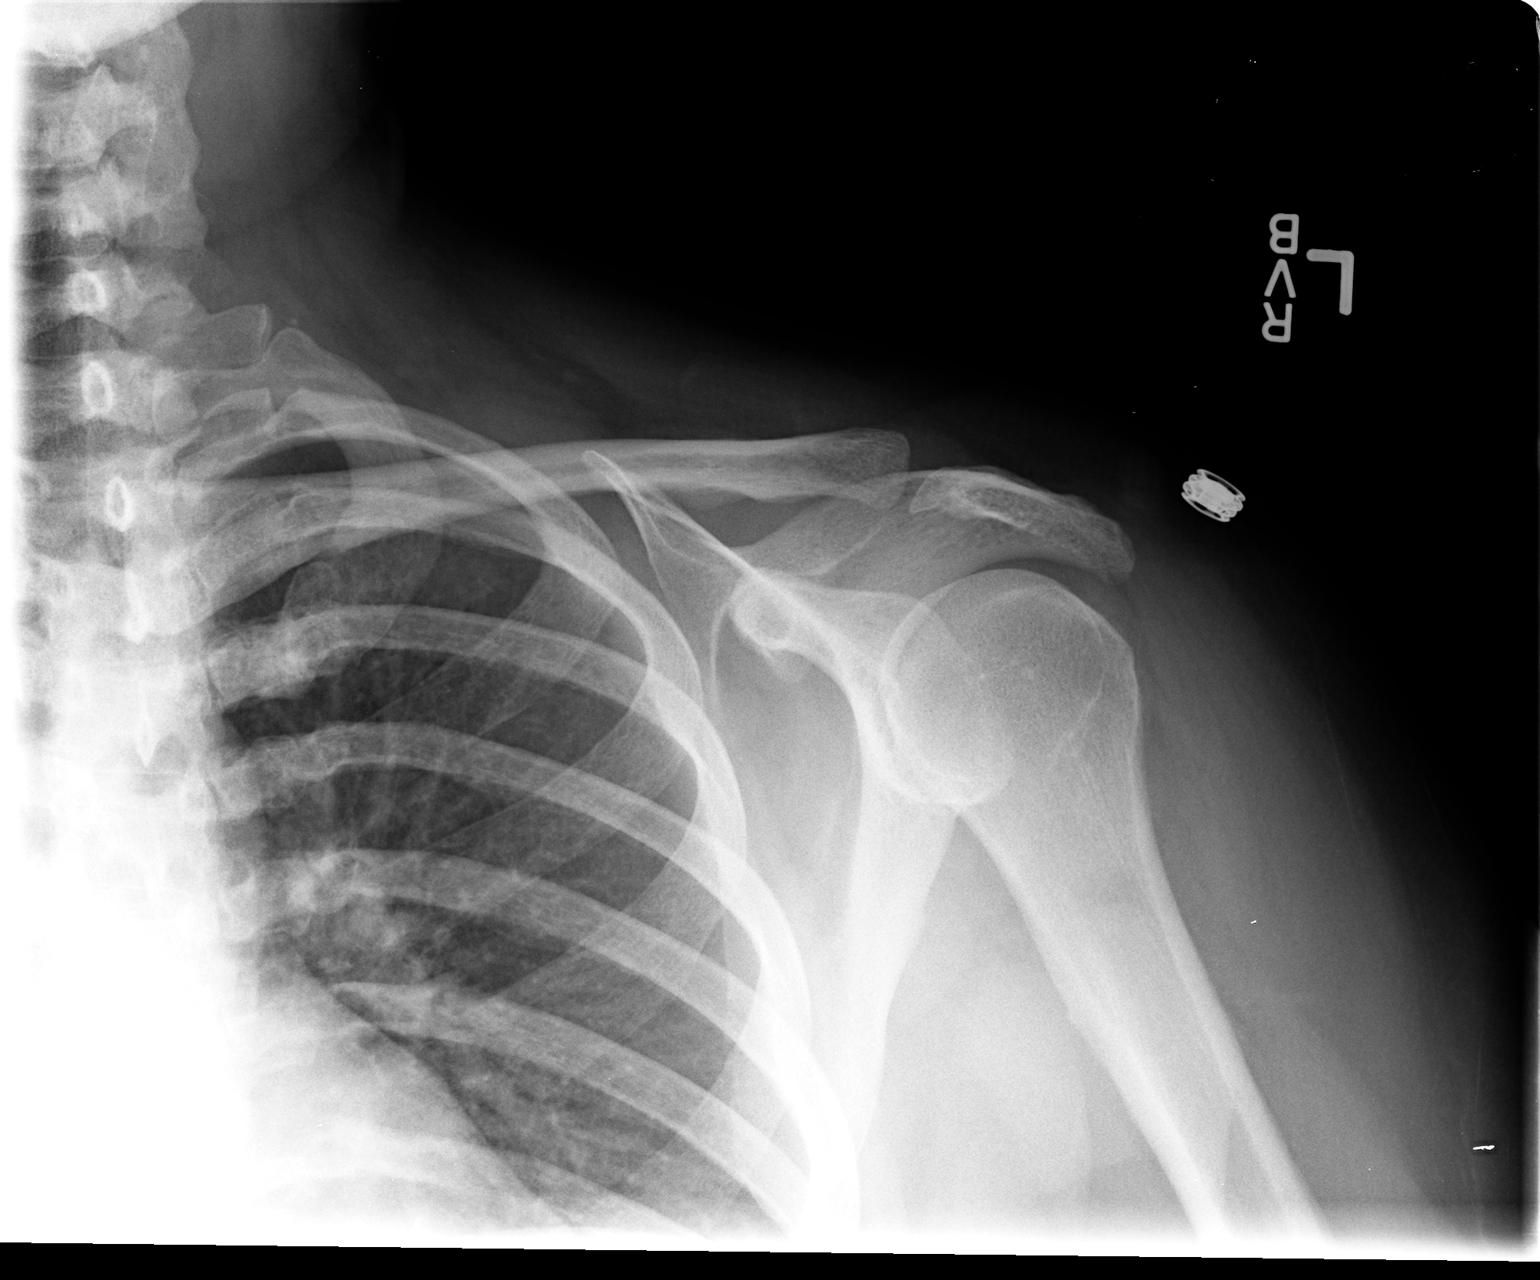

[view not recorded (2 of 3)]
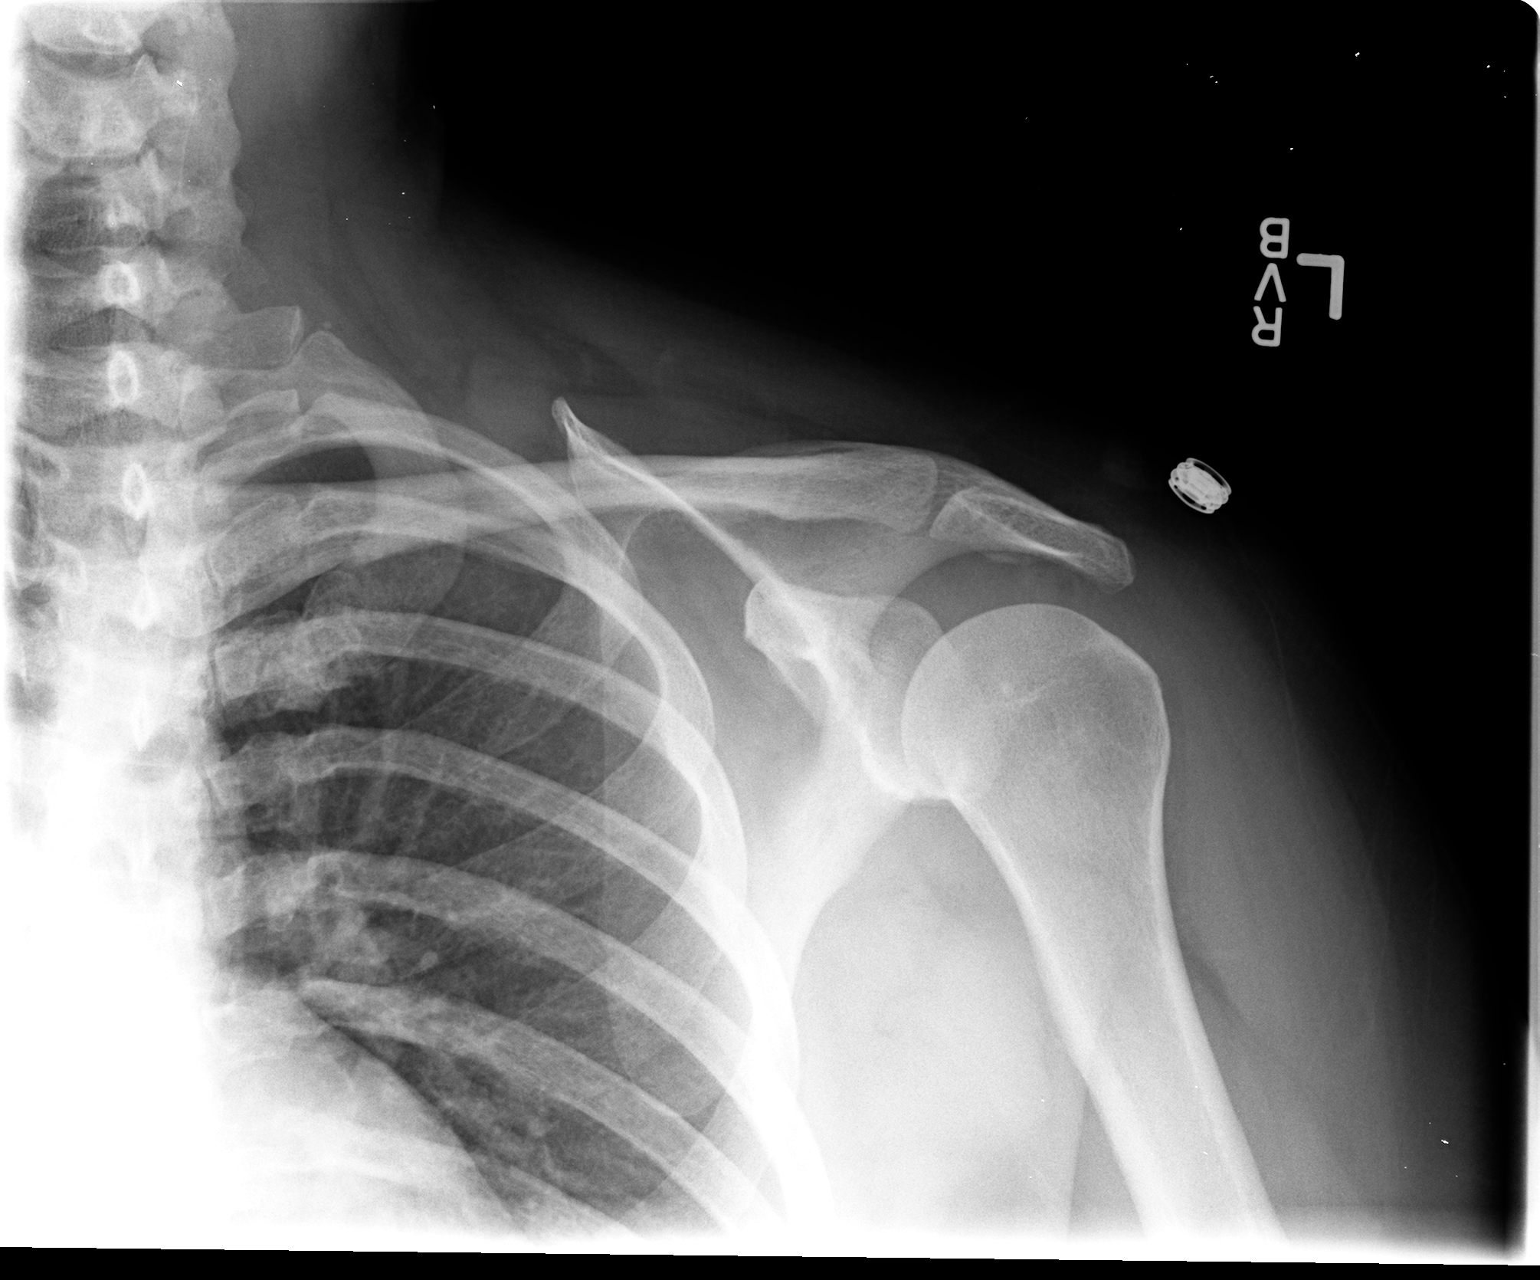

[view not recorded (3 of 3)]
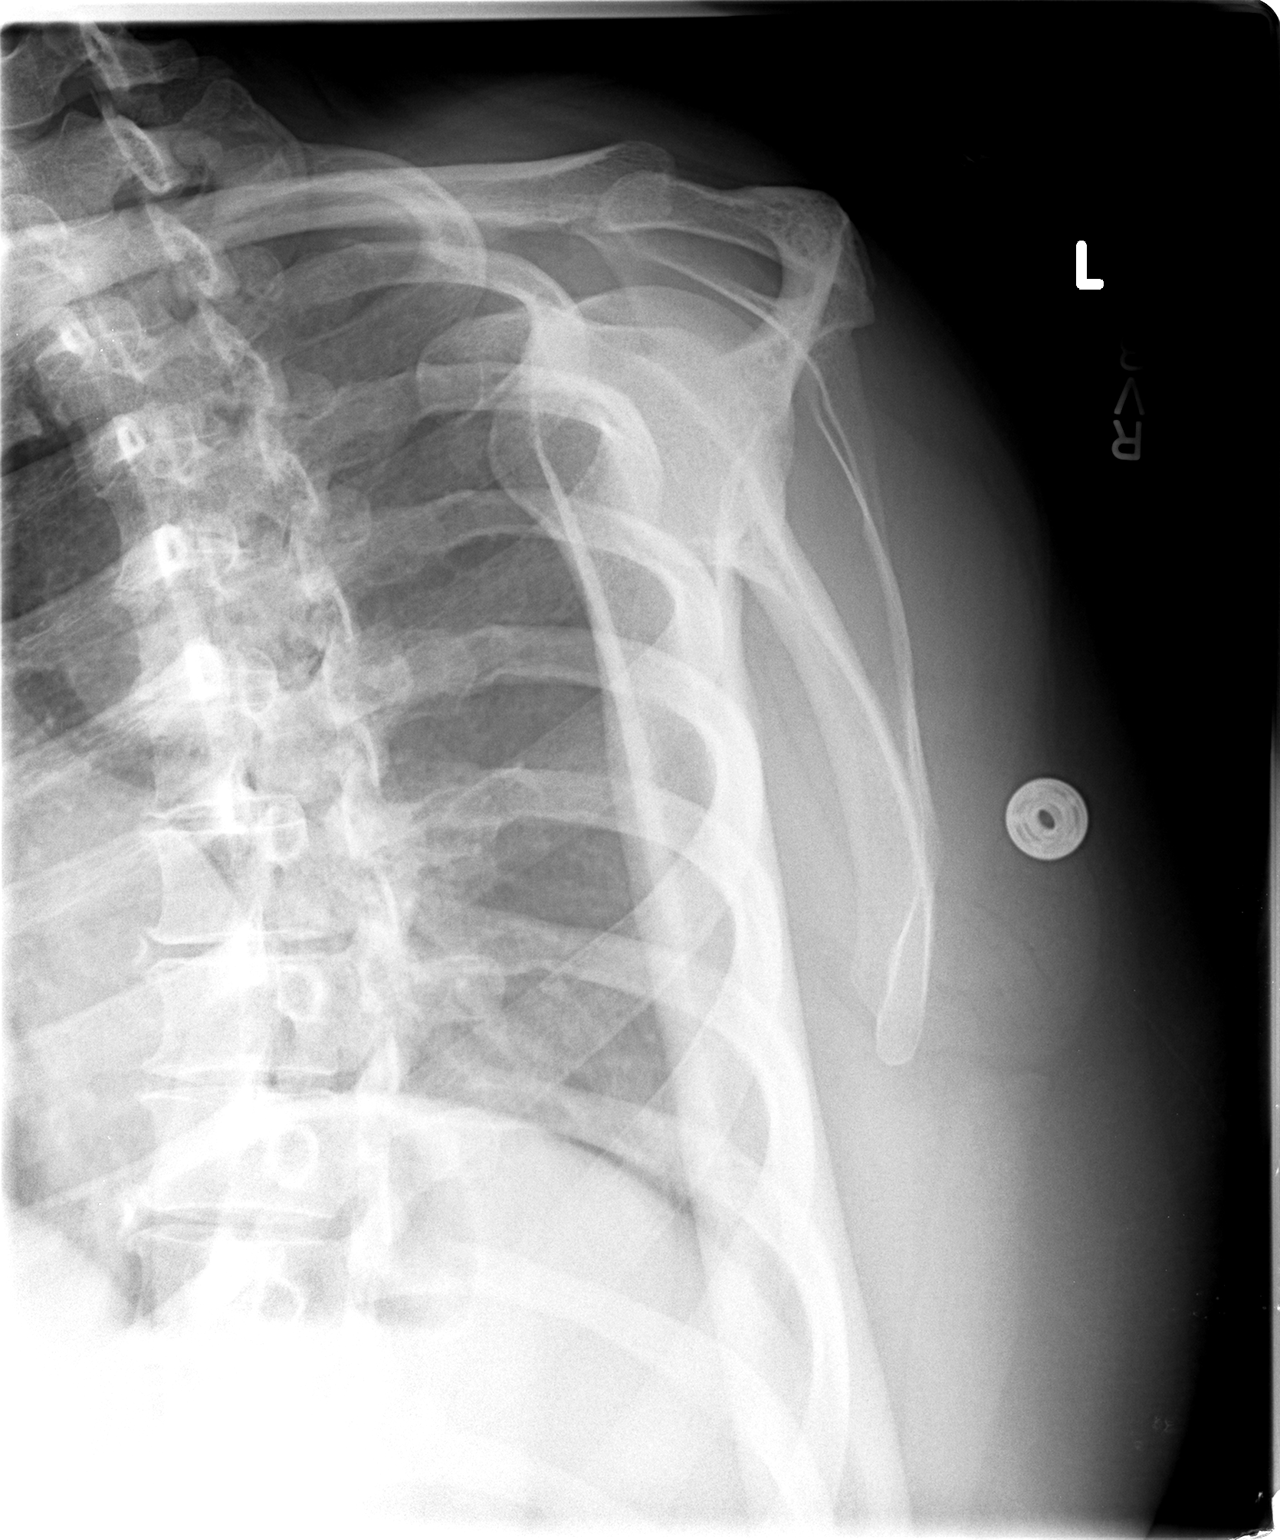

[3 of 3 positions shown; findings below may reference images not displayed]

FINDINGS: No acute fracture is seen.  There is a downward sloping
acromion which predisposes to impingement.  The AC joint is
normally aligned.
IMPRESSION: No acute fracture.  Question impingement.

## 2013-02-04 ENCOUNTER — Ambulatory Visit (INDEPENDENT_AMBULATORY_CARE_PROVIDER_SITE_OTHER): Payer: BC Managed Care – PPO | Admitting: Otolaryngology

## 2013-02-04 DIAGNOSIS — J343 Hypertrophy of nasal turbinates: Secondary | ICD-10-CM

## 2013-02-04 DIAGNOSIS — J310 Chronic rhinitis: Secondary | ICD-10-CM

## 2013-02-04 DIAGNOSIS — J320 Chronic maxillary sinusitis: Secondary | ICD-10-CM

## 2013-03-11 ENCOUNTER — Encounter (INDEPENDENT_AMBULATORY_CARE_PROVIDER_SITE_OTHER): Payer: Self-pay

## 2013-03-11 ENCOUNTER — Ambulatory Visit (INDEPENDENT_AMBULATORY_CARE_PROVIDER_SITE_OTHER): Payer: BC Managed Care – PPO | Admitting: Otolaryngology

## 2013-03-11 DIAGNOSIS — H9319 Tinnitus, unspecified ear: Secondary | ICD-10-CM

## 2013-03-11 DIAGNOSIS — J343 Hypertrophy of nasal turbinates: Secondary | ICD-10-CM

## 2013-03-11 DIAGNOSIS — J310 Chronic rhinitis: Secondary | ICD-10-CM

## 2013-09-30 ENCOUNTER — Ambulatory Visit (INDEPENDENT_AMBULATORY_CARE_PROVIDER_SITE_OTHER): Payer: BC Managed Care – PPO | Admitting: Otolaryngology

## 2013-09-30 DIAGNOSIS — J310 Chronic rhinitis: Secondary | ICD-10-CM

## 2013-09-30 DIAGNOSIS — J343 Hypertrophy of nasal turbinates: Secondary | ICD-10-CM

## 2015-03-28 ENCOUNTER — Other Ambulatory Visit: Payer: Self-pay | Admitting: Orthopedic Surgery

## 2015-03-28 DIAGNOSIS — M545 Low back pain: Secondary | ICD-10-CM

## 2015-04-19 ENCOUNTER — Ambulatory Visit
Admission: RE | Admit: 2015-04-19 | Discharge: 2015-04-19 | Disposition: A | Discharge status: Home / Self Care | Payer: BLUE CROSS/BLUE SHIELD | Source: Ambulatory Visit | Attending: Orthopedic Surgery | Admitting: Orthopedic Surgery

## 2015-04-19 DIAGNOSIS — M545 Low back pain: Secondary | ICD-10-CM

## 2016-03-20 ENCOUNTER — Encounter: Payer: Self-pay | Admitting: Gastroenterology

## 2016-03-20 ENCOUNTER — Ambulatory Visit (INDEPENDENT_AMBULATORY_CARE_PROVIDER_SITE_OTHER): Payer: 59 | Admitting: Gastroenterology

## 2016-03-20 VITALS — BP 125/67 | HR 82 | Temp 97.8°F | Ht 66.0 in | Wt 284.4 lb

## 2016-03-20 DIAGNOSIS — K760 Fatty (change of) liver, not elsewhere classified: Secondary | ICD-10-CM | POA: Diagnosis not present

## 2016-03-20 NOTE — Patient Instructions (Signed)
We have ordered an ultrasound to evaluate your liver.  We will repeat liver numbers in 6 months.   I will see you back in 1 year!

## 2016-03-20 NOTE — Progress Notes (Signed)
Referring Provider: Claggett, Autumn PattyElin, PA-C Primary Care Physician:  Abran RichardBAUCOM, JENNY B, PA-C Primary GI: Dr. Jena Gaussourk   Chief Complaint  Patient presents with  . Follow-up    HPI:   Leslie Mendez is a 40 y.o. female presenting today with a history of fatty liver. Returns today to discuss fatty liver and follow-up for this. Denies abdominal pain, constipation, rectal bleeding, reflux, dysphagia. She is trying to make better choices with food intake. Thinks she may pursue the ketogenic diet. Concerned about lipomas located in abdomen. Last ultrasound in 2013.   Past Medical History:  Diagnosis Date  . Fatty liver   . Hypercholesterolemia   . Migraine   . Polycystic ovaries     Past Surgical History:  Procedure Laterality Date  . CHOLECYSTECTOMY    . COLONOSCOPY  01/09/2012   anal canal hemorrhoids-likely source of trivial hematochezia  . ESOPHAGOGASTRODUODENOSCOPY  01/09/2012   Very small hiatal hernia; otherwise normal examination.  Marland Kitchen. UTERINE SEPTUM RESECTION      Current Outpatient Prescriptions  Medication Sig Dispense Refill  . Biotin w/ Vitamins C & E (HAIR/SKIN/NAILS PO) Take by mouth daily.    . Folic Acid 0.8 MG CAPS Take 0.8 mg by mouth daily.    Laroy Apple. INOSITOL NIACINATE PO Take 1,000 mg by mouth 2 (two) times daily.    . Misc Natural Products (TURMERIC CURCUMIN) CAPS Take by mouth 2 (two) times daily.    . rizatriptan (MAXALT) 10 MG tablet Take 10 mg by mouth as needed. May repeat in 2 hours if needed    . simvastatin (ZOCOR) 20 MG tablet Take 20 mg by mouth daily.     No current facility-administered medications for this visit.     Allergies as of 03/20/2016 - Review Complete 03/20/2016  Allergen Reaction Noted  . Codeine Nausea And Vomiting 11/18/2010    Family History  Problem Relation Age of Onset  . Colon cancer Father 7550    deceased at age 39    Social History   Social History  . Marital status: Married    Spouse name: N/A  . Number of children: 0  .  Years of education: N/A   Occupational History  . [employer]    Nurse Health   Social History Main Topics  . Smoking status: Never Smoker  . Smokeless tobacco: None  . Alcohol use Yes     Comment: once a year  . Drug use: No  . Sexual activity: Yes    Birth control/ protection: None   Other Topics Concern  . None   Social History Narrative  . None    Review of Systems: As mentioned in HPI   Physical Exam: BP 125/67   Pulse 82   Temp 97.8 F (36.6 C) (Oral)   Ht 5\' 6"  (1.676 m)   Wt 284 lb 6.4 oz (129 kg)   BMI 45.90 kg/m  General:   Alert and oriented. No distress noted. Pleasant and cooperative.  Head:  Normocephalic and atraumatic. Eyes:  Conjuctiva clear without scleral icterus. Mouth:  Oral mucosa pink and moist. Good dentition. No lesions. Heart:  S1, S2 present without murmurs, rubs, or gallops. Regular rate and rhythm. Abdomen:  +BS, soft, non-tender and non-distended. No rebound or guarding. No HSM or masses noted. Small lipoma in RUQ (verified by ultrasound in 2013)  Msk:  Symmetrical without gross deformities. Normal posture. Extremities:  Without edema. Neurologic:  Alert and  oriented x4 Psych:  Alert and cooperative. Normal mood and affect.

## 2016-03-23 ENCOUNTER — Telehealth: Payer: Self-pay | Admitting: Gastroenterology

## 2016-03-23 NOTE — Assessment & Plan Note (Signed)
40 year old female with known fatty liver disease, last US in 2013. No concerning features. Will need outside labs from PCP. Discussed dietary/behavior modification. Continue weight loss measures. Proceed with updated ultrasound. Return in 1 year. Discuss colonoscopy at that time due to family history of colon cancer.

## 2016-03-25 NOTE — Progress Notes (Signed)
CC'D TO PCP °

## 2016-03-26 NOTE — Telephone Encounter (Signed)
Opened in error

## 2016-04-04 ENCOUNTER — Telehealth: Payer: Self-pay | Admitting: Gastroenterology

## 2016-04-04 NOTE — Telephone Encounter (Signed)
Leslie Mendez, can we retrieve last LFTs from PCP? I thought I had them with me, but I don't.

## 2016-04-04 NOTE — Telephone Encounter (Signed)
Sent request to PCP

## 2016-04-05 ENCOUNTER — Ambulatory Visit (HOSPITAL_COMMUNITY)
Admission: RE | Admit: 2016-04-05 | Discharge: 2016-04-05 | Disposition: A | Discharge status: Home / Self Care | Payer: 59 | Source: Ambulatory Visit | Attending: Gastroenterology | Admitting: Gastroenterology

## 2016-04-05 DIAGNOSIS — K760 Fatty (change of) liver, not elsewhere classified: Secondary | ICD-10-CM

## 2016-04-10 ENCOUNTER — Other Ambulatory Visit: Payer: Self-pay | Admitting: Gastroenterology

## 2016-04-10 DIAGNOSIS — K760 Fatty (change of) liver, not elsewhere classified: Secondary | ICD-10-CM

## 2016-04-11 NOTE — Progress Notes (Signed)
Right lipoma likely in RUQ. Liver overall looks good without evidence of cirrhosis. Sending message in MyChart.

## 2016-05-21 NOTE — Telephone Encounter (Signed)
LFTs in Oct 2017:  Tbili 1.1, Alk Phos 64, AST 12, ALT 18.

## 2016-08-01 ENCOUNTER — Other Ambulatory Visit: Payer: Self-pay

## 2016-08-01 DIAGNOSIS — K760 Fatty (change of) liver, not elsewhere classified: Secondary | ICD-10-CM

## 2017-03-21 ENCOUNTER — Ambulatory Visit: Payer: 59 | Admitting: Gastroenterology

## 2017-03-21 ENCOUNTER — Encounter: Payer: Self-pay | Admitting: Gastroenterology

## 2017-03-21 ENCOUNTER — Other Ambulatory Visit: Payer: Self-pay | Admitting: *Deleted

## 2017-03-21 ENCOUNTER — Encounter: Payer: Self-pay | Admitting: *Deleted

## 2017-03-21 VITALS — BP 141/67 | HR 78 | Temp 97.6°F | Ht 65.0 in | Wt 248.6 lb

## 2017-03-21 DIAGNOSIS — K760 Fatty (change of) liver, not elsewhere classified: Secondary | ICD-10-CM

## 2017-03-21 DIAGNOSIS — Z800 Family history of malignant neoplasm of digestive organs: Secondary | ICD-10-CM | POA: Insufficient documentation

## 2017-03-21 MED ORDER — NA SULFATE-K SULFATE-MG SULF 17.5-3.13-1.6 GM/177ML PO SOLN: 1.0000 | ORAL | 0 refills | Status: DC

## 2017-03-21 NOTE — Progress Notes (Signed)
Please NIC for one year follow up.

## 2017-03-21 NOTE — Patient Instructions (Signed)
1. Please have your blood work to follow-up on fatty liver.  We will contact you within 5-7 business days with the results. 2. Great job on your weight loss!  Keep up the good work! 3. Colonoscopy as scheduled.  Please see separate instructions.

## 2017-03-21 NOTE — Assessment & Plan Note (Signed)
Monitor LFTs at least yearly.  Congratulated on her weight loss efforts.  Continue weight loss measures, exercise.  Return in 1 year.

## 2017-03-21 NOTE — Progress Notes (Signed)
Primary Care Physician: Zhou-Talbert, Ralene Bathe, MD  Primary Gastroenterologist:  Roetta Sessions, MD   Chief Complaint  Patient presents with  . Colonoscopy    consult, no problems    HPI: Leslie Mendez is a 41 y.o. female here for follow-up of fatty liver and to schedule colonoscopy.  Her weight is down 35 pounds since last year based on our records. She reports total loss of 65 pounds from her highest weight. She has been working hard to lose weight.  Last LFTs on file were from 1 year ago were normal.  Last colonoscopy was in 2013 and unremarkable.  Father was diagnosed to colon cancer at age 104.  Based on guidelines would recommend a colonoscopy at age 47 for the patient.  Since her last colonoscopy was over 5 years ago it is reasonable to pursue another one at this time.  Patient clinically doing well. No bowel concerns. No melena, brbpr. Rare heartburn. No dysphagia. No vomiting.   Current Outpatient Medications  Medication Sig Dispense Refill  . Biotin w/ Vitamins C & E (HAIR/SKIN/NAILS PO) Take by mouth daily.    . cephALEXin (KEFLEX) 500 MG capsule Take 500 mg as needed by mouth.    . Cholecalciferol (VITAMIN D) 2000 units CAPS Take daily by mouth.    . Coenzyme Q10 (CO Q 10) 100 MG CAPS Take 1 capsule at bedtime by mouth.    . cyclobenzaprine (FLEXERIL) 10 MG tablet Take 10 mg as needed by mouth for muscle spasms.    . Folic Acid 0.8 MG CAPS Take 0.8 mg by mouth daily.    . Glucosamine-Chondroit-Vit C-Mn (GLUCOSAMINE 1500 COMPLEX PO) Take 2 (two) times daily by mouth.    Marland Kitchen ibuprofen (ADVIL,MOTRIN) 800 MG tablet Take 800 mg every 8 (eight) hours as needed by mouth.    Laroy Apple NIACINATE PO Take 1,000 mg by mouth 2 (two) times daily.    . Iron-Vitamins (GERITOL PO) Take daily by mouth.    . magnesium oxide (MAG-OX) 400 MG tablet Take 400 mg daily by mouth.    . Misc Natural Products (TURMERIC CURCUMIN) CAPS Take by mouth 2 (two) times daily.    . rizatriptan (MAXALT) 10  MG tablet Take 10 mg by mouth as needed. May repeat in 2 hours if needed    . simvastatin (ZOCOR) 20 MG tablet Take 20 mg by mouth daily.    . traMADol (ULTRAM) 50 MG tablet Take 50 mg every 6 (six) hours as needed by mouth.    . vitamin B-12 (CYANOCOBALAMIN) 500 MCG tablet Take 500 mcg daily by mouth.     No current facility-administered medications for this visit.     Allergies as of 03/21/2017 - Review Complete 03/21/2017  Allergen Reaction Noted  . Codeine Nausea And Vomiting 11/18/2010   Past Medical History:  Diagnosis Date  . Fatty liver   . Hypercholesterolemia   . Migraine   . Polycystic ovaries    Past Surgical History:  Procedure Laterality Date  . CHOLECYSTECTOMY    . COLONOSCOPY N/A 01/09/2012   Performed by Corbin Ade, MD at [facility]  . ESOPHAGOGASTRODUODENOSCOPY (EGD) N/A 01/09/2012   Performed by Corbin Ade, MD at [facility]  . UTERINE SEPTUM RESECTION     Family History  Problem Relation Age of Onset  . Colon cancer Father 57       deceased at age 19   Social History   Socioeconomic History  .  Marital status: Married    Spouse name: None  . Number of children: 0  . Years of education: None  . Highest education level: None  Social Needs  . Financial resource strain: None  . Food insecurity - worry: None  . Food insecurity - inability: None  . Transportation needs - medical: None  . Transportation needs - non-medical: None  Occupational History  . Occupation: [employer]    Employer: [employer]    Comment: Nurse Health  Tobacco Use  . Smoking status: Never Smoker  . Smokeless tobacco: Never Used  Substance and Sexual Activity  . Alcohol use: No    Frequency: Never  . Drug use: No  . Sexual activity: Yes    Birth control/protection: None  Other Topics Concern  . None  Social History Narrative  . None    ROS:  General: Negative for anorexia, weight loss, fever, chills, fatigue,  weakness. ENT: Negative for hoarseness, difficulty swallowing , nasal congestion. CV: Negative for chest pain, angina, palpitations, dyspnea on exertion, peripheral edema.  Respiratory: Negative for dyspnea at rest, dyspnea on exertion, cough, sputum, wheezing.  GI: See history of present illness. GU:  Negative for dysuria, hematuria, urinary incontinence, urinary frequency, nocturnal urination.  Endo: Negative for unusual weight change.    Physical Examination:   BP (!) 141/67   Pulse 78   Temp 97.6 F (36.4 C) (Oral)   Ht 5\' 5"  (1.651 m)   Wt 248 lb 9.6 oz (112.8 kg)   LMP 03/20/2017   BMI 41.37 kg/m   General: Well-nourished, well-developed in no acute distress.  Eyes: No icterus. Mouth: Oropharyngeal mucosa moist and pink , no lesions erythema or exudate. Lungs: Clear to auscultation bilaterally.  Heart: Regular rate and rhythm, no murmurs rubs or gallops.  Abdomen: Bowel sounds are normal, nontender, nondistended, no hepatosplenomegaly or masses, no abdominal bruits or hernia , no rebound or guarding.   Extremities: No lower extremity edema. No clubbing or deformities. Neuro: Alert and oriented x 4   Skin: Warm and dry, no jaundice.   Psych: Alert and cooperative, normal mood and affect.

## 2017-03-21 NOTE — Assessment & Plan Note (Signed)
Family history of colon cancer, father diagnosed at age 40, deceased at age 40.  Proceed with updated colonoscopy at this time for high risk screening.  Augment conscious sedation with Phenergan 25 mg IV 45 minutes before the procedure.  I have discussed the risks, alternatives, benefits with regards to but not limited to the risk of reaction to medication, bleeding, infection, perforation and the patient is agreeable to proceed. Written consent to be obtained.

## 2017-03-24 NOTE — Progress Notes (Signed)
ON RECALL  °

## 2017-03-24 NOTE — Progress Notes (Signed)
cc'ed to pcp °

## 2017-03-31 LAB — HEPATIC FUNCTION PANEL
AG Ratio: 1.5 (calc) (ref 1.0–2.5)
ALT: 21 U/L (ref 6–29)
AST: 13 U/L (ref 10–30)
Albumin: 4 g/dL (ref 3.6–5.1)
Alkaline phosphatase (APISO): 45 U/L (ref 33–115)
Bilirubin, Direct: 0.1 mg/dL (ref 0.0–0.2)
Globulin: 2.7 g/dL (calc) (ref 1.9–3.7)
Indirect Bilirubin: 0.4 mg/dL (calc) (ref 0.2–1.2)
Total Bilirubin: 0.5 mg/dL (ref 0.2–1.2)
Total Protein: 6.7 g/dL (ref 6.1–8.1)

## 2017-04-01 NOTE — Progress Notes (Signed)
LFTs normal. Repeat in 1 year. [place] who recently saw her. I am sending a note in Mychart.

## 2017-04-02 ENCOUNTER — Other Ambulatory Visit: Payer: Self-pay

## 2017-04-02 DIAGNOSIS — K760 Fatty (change of) liver, not elsewhere classified: Secondary | ICD-10-CM

## 2017-04-02 NOTE — Progress Notes (Unsigned)
lft

## 2017-04-10 ENCOUNTER — Telehealth: Payer: Self-pay | Admitting: *Deleted

## 2017-04-10 NOTE — Telephone Encounter (Signed)
PA submitted for TCS on 04/24/17 through [facility]. This was approved. Auth # [id number]. Copy has also been placed for scan in chart.

## 2017-04-14 NOTE — Progress Notes (Signed)
noted 

## 2017-04-16 ENCOUNTER — Telehealth: Payer: Self-pay | Admitting: Internal Medicine

## 2017-04-16 NOTE — Telephone Encounter (Signed)
Patient wants to cancel procedure with RMR on 12/20 due to insurance and reschedule in March 2019.   [phone]

## 2017-04-16 NOTE — Telephone Encounter (Signed)
Sounds reasonable.

## 2017-04-16 NOTE — Telephone Encounter (Signed)
LMOVM for pt to confirm cancellation. March schedule not available yet.

## 2017-04-16 NOTE — Telephone Encounter (Signed)
Pt called office. OV scheduled for 06/13/17 to reschedule procedure. She wants to wait until March 2019 to have procedure d/t she will have to pay $2000. Her insurance starts over in March. LMOVM and informed Endo scheduler.

## 2017-04-24 ENCOUNTER — Encounter (HOSPITAL_COMMUNITY): Admission: RE | Payer: Self-pay | Source: Ambulatory Visit

## 2017-04-24 ENCOUNTER — Ambulatory Visit (HOSPITAL_COMMUNITY): Admission: RE | Admit: 2017-04-24 | Payer: 59 | Source: Ambulatory Visit | Admitting: Internal Medicine

## 2017-04-24 SURGERY — COLONOSCOPY
Anesthesia: Moderate Sedation

## 2017-06-13 ENCOUNTER — Encounter: Payer: Self-pay | Admitting: Gastroenterology

## 2017-06-13 ENCOUNTER — Ambulatory Visit: Payer: 59 | Admitting: Gastroenterology

## 2017-06-13 ENCOUNTER — Other Ambulatory Visit: Payer: Self-pay | Admitting: *Deleted

## 2017-06-13 ENCOUNTER — Encounter: Payer: Self-pay | Admitting: *Deleted

## 2017-06-13 VITALS — BP 137/83 | HR 70 | Temp 97.4°F | Ht 65.0 in | Wt 259.4 lb

## 2017-06-13 DIAGNOSIS — Z800 Family history of malignant neoplasm of digestive organs: Secondary | ICD-10-CM

## 2017-06-13 NOTE — Patient Instructions (Signed)
1. colonoscopy as scheduled.  Please see separate instructions.

## 2017-06-13 NOTE — Assessment & Plan Note (Signed)
Due for high risk screening colonoscopy due to father diagnosed at age 41 with CRC.  I have discussed the risks, alternatives, benefits with regards to but not limited to the risk of reaction to medication, bleeding, infection, perforation and the patient is agreeable to proceed. Written consent to be obtained.

## 2017-06-13 NOTE — Progress Notes (Signed)
Primary Care Physician:  Zhou-Talbert, Elwyn Lade, MD  Primary Gastroenterologist:  Garfield Cornea, MD   Chief Complaint  Patient presents with  . Colonoscopy    HPI:  Leslie Mendez is a 42 y.o. female here to schedule colonoscopy.  She was scheduled for back in December but due to insurance issues she requested to wait till after March.  She also has a history of fatty liver, intentional weight loss over 65 pounds last year.  She has gained about 10 pounds in the past 3 months however.  Last colonoscopy in 2013 was unremarkable.  Father diagnosed with colon cancer at age 61.  She is due for 5-year follow-up exam. Clinically doing well. No constipation, diarrhea, melena, brbpr, abd pain, ugi sx. Did well with conscious sedation in the past.   Current Outpatient Medications  Medication Sig Dispense Refill  . Biotin w/ Vitamins C & E (HAIR/SKIN/NAILS PO) Take by mouth daily.    . cephALEXin (KEFLEX) 500 MG capsule Take 500 mg as needed by mouth.    . Cholecalciferol (VITAMIN D) 2000 units CAPS Take daily by mouth.    . Coenzyme Q10 (CO Q 10) 100 MG CAPS Take 1 capsule at bedtime by mouth.    . cyclobenzaprine (FLEXERIL) 10 MG tablet Take 10 mg as needed by mouth for muscle spasms.    . Folic Acid 0.8 MG CAPS Take 0.8 mg by mouth daily.    . Glucosamine-Chondroit-Vit C-Mn (GLUCOSAMINE 1500 COMPLEX PO) Take 2 (two) times daily by mouth.    Marland Kitchen ibuprofen (ADVIL,MOTRIN) 800 MG tablet Take 800 mg every 8 (eight) hours as needed by mouth.    Birdie Hopes NIACINATE PO Take 1,000 mg by mouth 2 (two) times daily.    . magnesium oxide (MAG-OX) 400 MG tablet Take 400 mg daily by mouth.    . Misc Natural Products (TURMERIC CURCUMIN) CAPS Take by mouth 2 (two) times daily.    . rizatriptan (MAXALT) 10 MG tablet Take 10 mg by mouth as needed. May repeat in 2 hours if needed    . simvastatin (ZOCOR) 20 MG tablet Take 20 mg by mouth daily.    . traMADol (ULTRAM) 50 MG tablet Take 50 mg every 6 (six) hours as  needed by mouth.    . vitamin B-12 (CYANOCOBALAMIN) 500 MCG tablet Take 500 mcg daily by mouth.    . Iron-Vitamins (GERITOL PO) Take daily by mouth.    . Na Sulfate-K Sulfate-Mg Sulf 17.5-3.13-1.6 GM/177ML SOLN Take 1 kit as directed by mouth. (Patient not taking: Reported on 06/13/2017) 1 Bottle 0   No current facility-administered medications for this visit.     Allergies as of 06/13/2017 - Review Complete 03/21/2017  Allergen Reaction Noted  . Codeine Nausea And Vomiting 11/18/2010    Past Medical History:  Diagnosis Date  . Fatty liver   . Hypercholesterolemia   . Migraine   . Polycystic ovaries     Past Surgical History:  Procedure Laterality Date  . CHOLECYSTECTOMY    . COLONOSCOPY  01/09/2012   anal canal hemorrhoids-likely source of trivial hematochezia  . ESOPHAGOGASTRODUODENOSCOPY  01/09/2012   Very small hiatal hernia; otherwise normal examination.  Marland Kitchen UTERINE SEPTUM RESECTION      Family History  Problem Relation Age of Onset  . Colon cancer Father 2       deceased at age 17    Social History   Socioeconomic History  . Marital status: Married    Spouse name: Not on  file  . Number of children: 0  . Years of education: Not on file  . Highest education level: Not on file  Social Needs  . Financial resource strain: Not on file  . Food insecurity - worry: Not on file  . Food insecurity - inability: Not on file  . Transportation needs - medical: Not on file  . Transportation needs - non-medical: Not on file  Occupational History  . Occupation: [employer]    Employer: [employer]    Comment: Nurse Health  Tobacco Use  . Smoking status: Never Smoker  . Smokeless tobacco: Never Used  Substance and Sexual Activity  . Alcohol use: No    Frequency: Never  . Drug use: No  . Sexual activity: Yes    Birth control/protection: None  Other Topics Concern  . Not on file  Social History Narrative  . Not on file       ROS:  General: Negative for anorexia, weight loss, fever, chills, fatigue, weakness. Eyes: Negative for vision changes.  ENT: Negative for hoarseness, difficulty swallowing , nasal congestion. CV: Negative for chest pain, angina, palpitations, dyspnea on exertion, peripheral edema.  Respiratory: Negative for dyspnea at rest, dyspnea on exertion, cough, sputum, wheezing.  GI: See history of present illness. GU:  Negative for dysuria, hematuria, urinary incontinence, urinary frequency, nocturnal urination.  MS: Negative for joint pain, low back pain.  Derm: Negative for rash or itching.  Neuro: Negative for weakness, abnormal sensation, seizure, frequent headaches, memory loss, confusion.  Psych: Negative for anxiety, depression, suicidal ideation, hallucinations.  Endo: Negative for unusual weight change.  Heme: Negative for bruising or bleeding. Allergy: Negative for rash or hives.    Physical Examination:  BP 137/83   Pulse 70   Temp (!) 97.4 F (36.3 C) (Oral)   Ht _0  (1.651 m)   Wt 259 lb 6.4 oz (117.7 kg)   BMI 43.17 kg/m    General: Well-nourished, well-developed in no acute distress.  Head: Normocephalic, atraumatic.   Eyes: Conjunctiva pink, no icterus. Mouth: Oropharyngeal mucosa moist and pink , no lesions erythema or exudate. Neck: Supple without thyromegaly, masses, or lymphadenopathy.  Lungs: Clear to auscultation bilaterally.  Heart: Regular rate and rhythm, no murmurs rubs or gallops.  Abdomen: Bowel sounds are normal, nontender, nondistended, no hepatosplenomegaly or masses, no abdominal bruits or    hernia , no rebound or guarding.   Rectal: deferred Extremities: No lower extremity edema. No clubbing or deformities.  Neuro: Alert and oriented x 4 , grossly normal neurologically.  Skin: Warm and dry, no rash or jaundice.   Psych: Alert and cooperative, normal mood and affect.    Imaging Studies: No results found.

## 2017-06-16 ENCOUNTER — Telehealth: Payer: Self-pay | Admitting: *Deleted

## 2017-06-16 NOTE — Telephone Encounter (Signed)
PA for TCS was approved [facility]. [id number].

## 2017-06-16 NOTE — Progress Notes (Signed)
CC'D TO PCP °

## 2017-08-13 ENCOUNTER — Encounter (HOSPITAL_COMMUNITY): Payer: Self-pay | Admitting: *Deleted

## 2017-08-13 ENCOUNTER — Other Ambulatory Visit: Payer: Self-pay

## 2017-08-13 ENCOUNTER — Ambulatory Visit (HOSPITAL_COMMUNITY)
Admission: RE | Admit: 2017-08-13 | Discharge: 2017-08-13 | Disposition: A | Discharge status: Home / Self Care | Payer: 59 | Source: Ambulatory Visit | Attending: Internal Medicine | Admitting: Internal Medicine

## 2017-08-13 ENCOUNTER — Encounter (HOSPITAL_COMMUNITY)
Admission: RE | Disposition: A | Discharge status: Home / Self Care | Payer: Self-pay | Source: Ambulatory Visit | Attending: Internal Medicine

## 2017-08-13 DIAGNOSIS — Z800 Family history of malignant neoplasm of digestive organs: Secondary | ICD-10-CM | POA: Insufficient documentation

## 2017-08-13 DIAGNOSIS — Z86010 Personal history of colonic polyps: Secondary | ICD-10-CM

## 2017-08-13 DIAGNOSIS — Z1211 Encounter for screening for malignant neoplasm of colon: Secondary | ICD-10-CM | POA: Insufficient documentation

## 2017-08-13 DIAGNOSIS — Z885 Allergy status to narcotic agent status: Secondary | ICD-10-CM | POA: Insufficient documentation

## 2017-08-13 DIAGNOSIS — K760 Fatty (change of) liver, not elsewhere classified: Secondary | ICD-10-CM | POA: Diagnosis not present

## 2017-08-13 DIAGNOSIS — Z79899 Other long term (current) drug therapy: Secondary | ICD-10-CM | POA: Diagnosis not present

## 2017-08-13 DIAGNOSIS — E282 Polycystic ovarian syndrome: Secondary | ICD-10-CM | POA: Insufficient documentation

## 2017-08-13 DIAGNOSIS — E7800 Pure hypercholesterolemia, unspecified: Secondary | ICD-10-CM | POA: Diagnosis not present

## 2017-08-13 HISTORY — PX: COLONOSCOPY: SHX5424

## 2017-08-13 SURGERY — COLONOSCOPY
Anesthesia: Moderate Sedation

## 2017-08-13 MED ORDER — MIDAZOLAM HCL 5 MG/5ML IJ SOLN
INTRAMUSCULAR | Status: AC
  Filled 2017-08-13: qty 10

## 2017-08-13 MED ORDER — ONDANSETRON HCL 4 MG/2ML IJ SOLN
INTRAMUSCULAR | Status: DC | PRN
  Administered 2017-08-13: 4 mg via INTRAVENOUS

## 2017-08-13 MED ORDER — ONDANSETRON HCL 4 MG/2ML IJ SOLN
INTRAMUSCULAR | Status: AC
  Filled 2017-08-13: qty 2

## 2017-08-13 MED ORDER — STERILE WATER FOR IRRIGATION IR SOLN
Status: DC | PRN
  Administered 2017-08-13: 2.5 mL

## 2017-08-13 MED ORDER — MIDAZOLAM HCL 5 MG/5ML IJ SOLN
INTRAMUSCULAR | Status: DC | PRN
  Administered 2017-08-13 (×3): 2 mg via INTRAVENOUS
  Administered 2017-08-13: 1 mg via INTRAVENOUS

## 2017-08-13 MED ORDER — MEPERIDINE HCL 100 MG/ML IJ SOLN
INTRAMUSCULAR | Status: DC | PRN
  Administered 2017-08-13 (×2): 50 mg via INTRAVENOUS
  Administered 2017-08-13: 25 mg via INTRAVENOUS

## 2017-08-13 MED ORDER — MEPERIDINE HCL 100 MG/ML IJ SOLN
INTRAMUSCULAR | Status: AC
  Filled 2017-08-13: qty 2

## 2017-08-13 MED ORDER — SODIUM CHLORIDE 0.9 % IV SOLN
INTRAVENOUS | Status: DC
  Administered 2017-08-13: 11:00:00 via INTRAVENOUS

## 2017-08-13 NOTE — H&P (Signed)
@LOGO@   Primary Care Physician:  Strader, Lindsey F, NP Primary Gastroenterologist:  Dr. Rourk  Pre-Procedure History & Physical: HPI:  Leslie Mendez is a 41 y.o. female is here for a screening colonoscopy.  High risk screening examination. No GI symptoms.  Past Medical History:  Diagnosis Date  . Fatty liver   . Hypercholesterolemia   . Migraine   . Polycystic ovaries     Past Surgical History:  Procedure Laterality Date  . CHOLECYSTECTOMY    . COLONOSCOPY  01/09/2012   anal canal hemorrhoids-likely source of trivial hematochezia  . ESOPHAGOGASTRODUODENOSCOPY  01/09/2012   Very small hiatal hernia; otherwise normal examination.  . UTERINE SEPTUM RESECTION      Prior to Admission medications   Medication Sig Start Date End Date Taking? Authorizing Provider  Biotin w/ Vitamins C & E (HAIR/SKIN/NAILS PO) Take 1-2 tablets by mouth See admin instructions. Take 2 tablets by mouth in the morning and take 1 tablet by mouth in the evening   Yes [provider]  Cholecalciferol (VITAMIN D) 2000 units CAPS Take 2,000 Units by mouth daily.    Yes [provider]  Coenzyme Q10 (CO Q 10) 100 MG CAPS Take 200 mg by mouth at bedtime.    Yes [provider]  cyclobenzaprine (FLEXERIL) 10 MG tablet Take 10 mg by mouth daily as needed for muscle spasms.    Yes [provider]  folic acid (FOLVITE) 800 MCG tablet Take 800 mcg by mouth daily.    Yes [provider]  Glucosamine-Chondroit-Vit C-Mn (GLUCOSAMINE 1500 COMPLEX PO) Take 1 tablet by mouth 2 (two) times daily.    Yes [provider]  ibuprofen (ADVIL,MOTRIN) 800 MG tablet Take 800 mg by mouth every 8 (eight) hours as needed for headache or moderate pain.    Yes [provider]  Inositol Niacinate 500 MG CAPS Take 1,000 mg by mouth 2 (two) times daily.   Yes [provider]  magnesium oxide (MAG-OX) 400 MG tablet Take 400 mg daily by mouth.   Yes [provider]   Misc Natural Products (TURMERIC CURCUMIN) CAPS Take 1 capsule by mouth 2 (two) times daily.    Yes [provider]  Multiple Vitamins-Minerals (MULTIVITAMIN PO) Take 1 tablet by mouth daily.   Yes [provider]  rizatriptan (MAXALT) 10 MG tablet Take 10 mg by mouth as needed for migraine. May repeat in 2 hours if needed   Yes [provider]  simvastatin (ZOCOR) 20 MG tablet Take 20 mg by mouth at bedtime.    Yes [provider]  Na Sulfate-K Sulfate-Mg Sulf 17.5-3.13-1.6 GM/177ML SOLN Take 1 kit as directed by mouth. Patient not taking: Reported on 06/13/2017 03/21/17   Rourk, Robert M, MD  traMADol (ULTRAM) 50 MG tablet Take 50 mg by mouth every 6 (six) hours as needed for moderate pain.     [provider]    Allergies as of 06/13/2017 - Review Complete 06/13/2017  Allergen Reaction Noted  . Codeine Nausea And Vomiting 11/18/2010    Family History  Problem Relation Age of Onset  . Colon cancer Father 50       deceased at age 53    Social History   Socioeconomic History  . Marital status: Married    Spouse name: Not on file  . Number of children: 0  . Years of education: Not on file  . Highest education level: Not on file  Occupational History  . Occupation:   [employer]    Employer: [employer]    Comment: Nurse Health  Social Needs  . Financial resource strain: Not on file  . Food insecurity:    Worry: Not on file    Inability: Not on file  . Transportation needs:    Medical: Not on file    Non-medical: Not on file  Tobacco Use  . Smoking status: Never Smoker  . Smokeless tobacco: Never Used  Substance and Sexual Activity  . Alcohol use: No    Frequency: Never  . Drug use: No  . Sexual activity: Yes    Birth control/protection: None  Lifestyle  . Physical activity:    Days per week: Not on file    Minutes per session: Not on file  . Stress: Not on file  Relationships  . Social  connections:    Talks on phone: Not on file    Gets together: Not on file    Attends religious service: Not on file    Active member of club or organization: Not on file    Attends meetings of clubs or organizations: Not on file    Relationship status: Not on file  . Intimate partner violence:    Fear of current or ex partner: Not on file    Emotionally abused: Not on file    Physically abused: Not on file    Forced sexual activity: Not on file  Other Topics Concern  . Not on file  Social History Narrative  . Not on file    Review of Systems: See HPI, otherwise negative ROS  Physical Exam: BP 135/68   Pulse 86   Temp 98.4 F (36.9 C) (Oral)   Resp 15   Ht 5' 6" (1.676 m)   Wt 253 lb (114.8 kg)   LMP 07/29/2017   SpO2 100%   BMI 40.84 kg/m  General:   Alert,  Well-developed, well-nourished, pleasant and cooperative in NAD Lungs:  Clear throughout to auscultation.   No wheezes, crackles, or rhonchi. No acute distress. Heart:  Regular rate and rhythm; no murmurs, clicks, rubs,  or gallops. Abdomen:  Soft, nontender and nondistended. No masses, hepatosplenomegaly or hernias noted. Normal bowel sounds, without guarding, and without rebound.    Impression/Plan: Leslie Mendez is now here to undergo a screening colonoscopy.  High-risk screening examination. I have offered the patient a screening colonoscopy.  The risks, benefits, limitations, alternatives and imponderables have been reviewed with the patient. Questions have been answered. All parties are agreeable.    Risks, benefits, limitations, imponderables and alternatives regarding colonoscopy have been reviewed with the patient. Questions have been answered. All parties agreeable.     Notice:  This dictation was prepared with Dragon dictation along with smaller phrase technology. Any transcriptional errors that result from this process are unintentional and may not be corrected upon review.  

## 2017-08-13 NOTE — Op Note (Signed)
[facility] Patient Name: Leslie Mendez Procedure Date: 08/13/2017 11:11 AM MRN: [id number] Date of Birth: 1975/09/12 Attending MD: Gennette Pac , MD CSN: [id number] Age: 42 Admit Type: Outpatient Procedure:                Colonoscopy Indications:              Screening in patient at increased risk: Family                            history of 1st-degree relative with colorectal                            cancer before age 39 years Providers:                Gennette Pac, MD, Nena Polio, RN, Dyann Ruddle Referring MD:              Medicines:                Midazolam 7 mg IV, Meperidine 125 mg IV,                            Ondansetron 4 mg IV Complications:            No immediate complications. Estimated Blood Loss:     Estimated blood loss: none. Procedure:                Pre-Anesthesia Assessment:                           - Prior to the procedure, a History and Physical                            was performed, and patient medications and                            allergies were reviewed. The patient's tolerance of                            previous anesthesia was also reviewed. The risks                            and benefits of the procedure and the sedation                            options and risks were discussed with the patient.                            All questions were answered, and informed consent                            was obtained. Prior Anticoagulants: The patient has  taken no previous anticoagulant or antiplatelet                            agents. ASA Grade Assessment: II - A patient with                            mild systemic disease. After reviewing the risks                            and benefits, the patient was deemed in                            satisfactory condition to undergo the procedure.                           After obtaining informed consent, the colonoscope                             was passed under direct vision. Throughout the                            procedure, the patient's blood pressure, pulse, and                            oxygen saturations were monitored continuously. The                            EC-[id number] ([id number] scope was introduced through                            the anus and advanced to the the cecum, identified                            by appendiceal orifice and ileocecal valve. The                            ileocecal valve, appendiceal orifice, and rectum                            were photographed. The entire colon was well                            visualized. The colonoscopy was performed without                            difficulty. The patient tolerated the procedure                            well. The quality of the bowel preparation was                            adequate. Scope In: 11:42:17 AM Scope Out: 11:51:14 AM Scope Withdrawal Time: 0 hours 6 minutes 20 seconds  Total Procedure Duration: 0 hours 8  minutes 57 seconds  Findings:      The perianal and digital rectal examinations were normal.      The colon appeared normal.      No additional abnormalities were found on retroflexion. Impression:               - The entire examined colon is normal.                           - No specimens collected. Moderate Sedation:      Moderate (conscious) sedation was administered by the endoscopy nurse       and supervised by the endoscopist. The following parameters were       monitored: oxygen saturation, heart rate, blood pressure, respiratory       rate, EKG, adequacy of pulmonary ventilation, and response to care.       Total physician intraservice time was 18 minutes. Recommendation:           - Patient has a contact number available for                            emergencies. The signs and symptoms of potential                            delayed complications were discussed with the                             patient. Return to normal activities tomorrow.                            Written discharge instructions were provided to the                            patient.                           - Resume previous diet.                           - Continue present medications.                           - Repeat colonoscopy in 5 years for screening                            purposes.                           - Return to GI office PRN. Procedure Code(s):        --- Professional ---                           [id number], Colonoscopy, flexible; diagnostic, including                            collection of specimen(s) by brushing or washing,  when performed (separate procedure)                           G0500, Moderate sedation services provided by the                            same physician or other qualified health care                            professional performing a gastrointestinal                            endoscopic service that sedation supports,                            requiring the presence of an independent trained                            observer to assist in the monitoring of the                            patient's level of consciousness and physiological                            status; initial 15 minutes of intra-service time;                            patient age 53 years or older (additional time may                            be reported with 40981, as appropriate) Diagnosis Code(s):        --- Professional ---                           Z80.0, Family history of malignant neoplasm of                            digestive organs CPT copyright 2017 American Medical Association. All rights reserved. The codes documented in this report are preliminary and upon coder review may  be revised to meet current compliance requirements. Gerrit Friends. Wyndell Cardiff, MD Gennette Pac, MD 08/13/2017 11:58:28 AM This report has been signed  electronically. Number of Addenda: 0

## 2017-08-13 NOTE — Discharge Instructions (Signed)

## 2017-08-19 ENCOUNTER — Encounter (HOSPITAL_COMMUNITY): Payer: Self-pay | Admitting: Internal Medicine

## 2018-02-09 ENCOUNTER — Other Ambulatory Visit: Payer: Self-pay | Admitting: *Deleted

## 2018-02-09 ENCOUNTER — Encounter: Payer: Self-pay | Admitting: *Deleted

## 2018-02-09 DIAGNOSIS — K760 Fatty (change of) liver, not elsewhere classified: Secondary | ICD-10-CM

## 2018-02-16 ENCOUNTER — Encounter: Payer: Self-pay | Admitting: Internal Medicine

## 2018-04-05 IMAGING — US US ABDOMEN COMPLETE
1 series · 14 of 25 positions shown · non-contrast
Comparison: 12/16/2011

CLINICAL DATA: Fatty liver disease, NASH, abdominal wall lipoma

EXAM:
ABDOMEN ULTRASOUND COMPLETE

[Series 1: us abdomen complete · 0.24mm/px · 77 acquisitions, 14 frames shown]
[im 1/77]
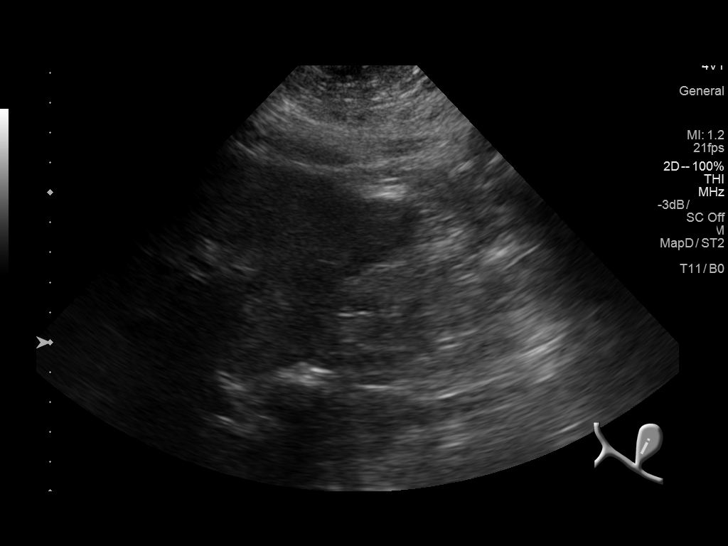
[im 7/77]
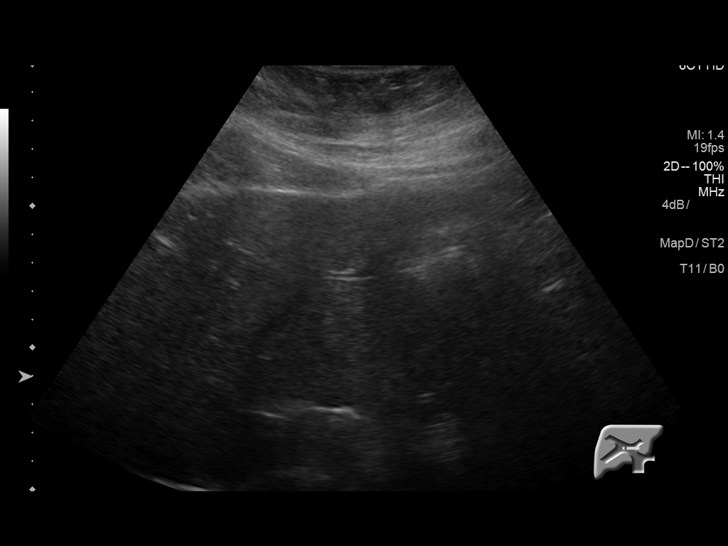
[im 13/77]
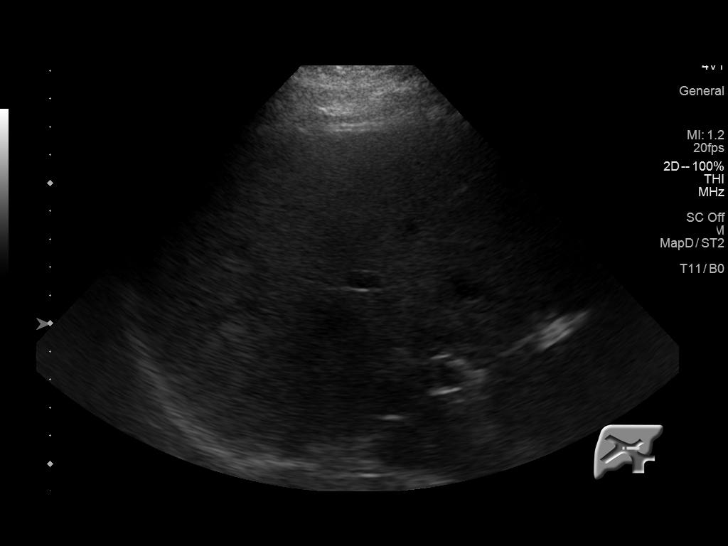
[im 20/77]
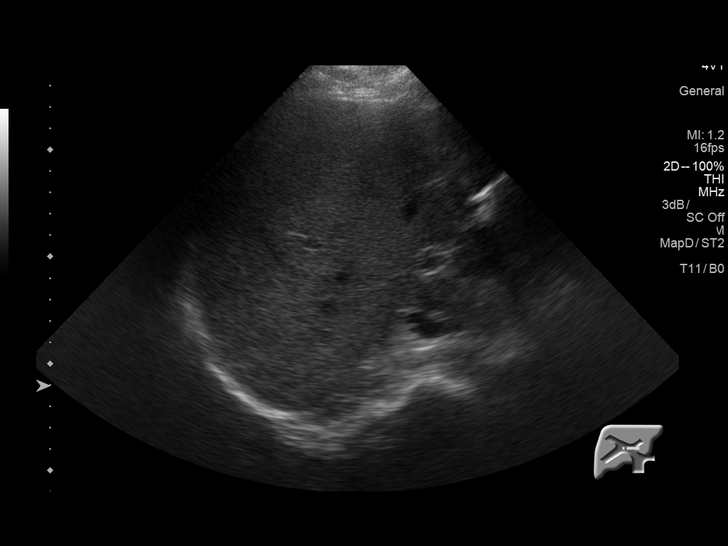
[im 26/77]
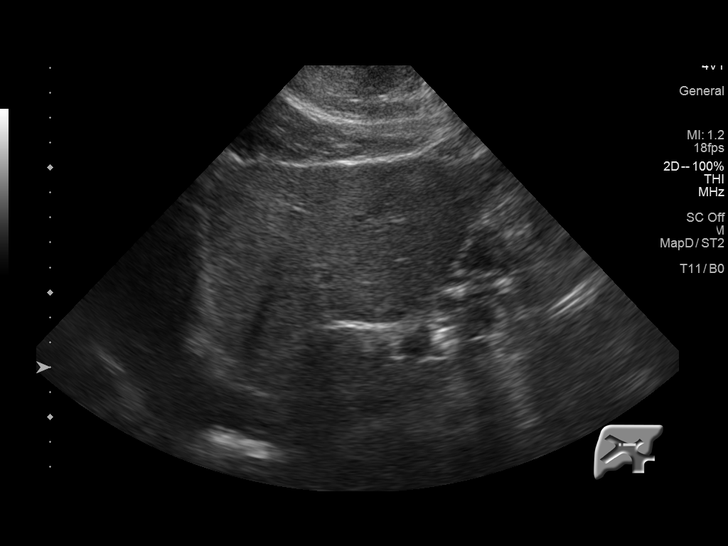
[im 29/77]
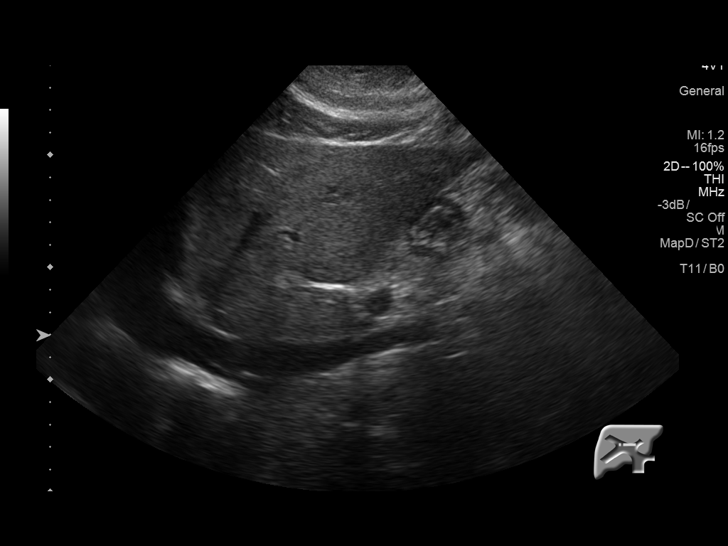
[im 35/77]
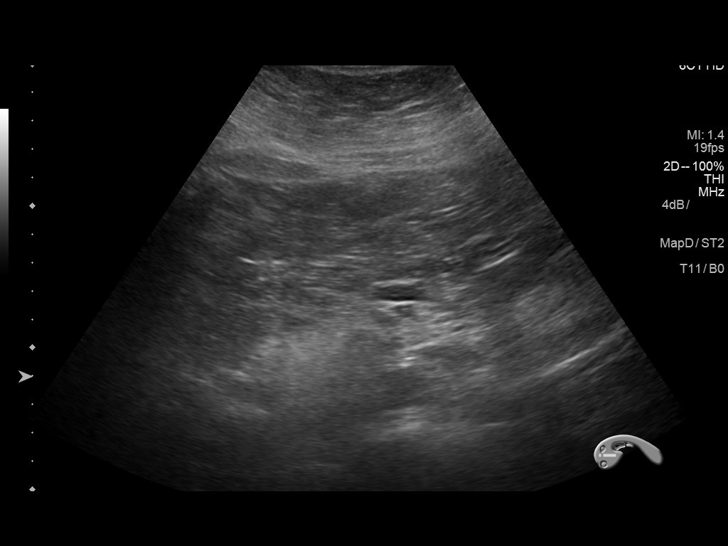
[im 42/77]
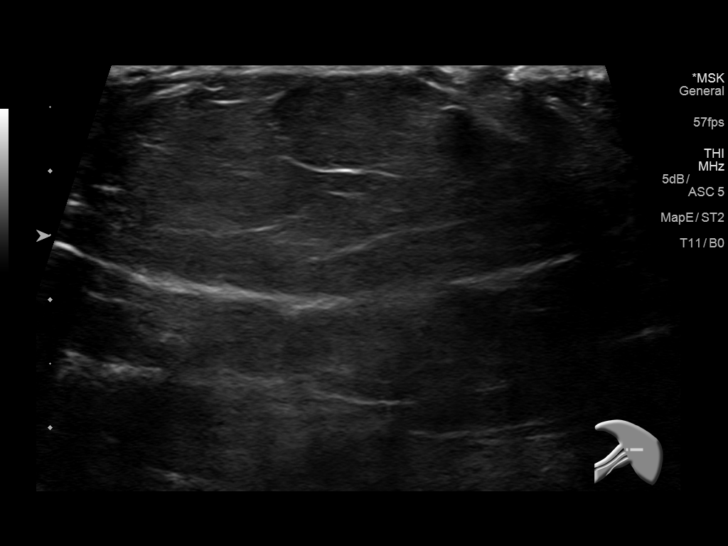
[im 48/77]
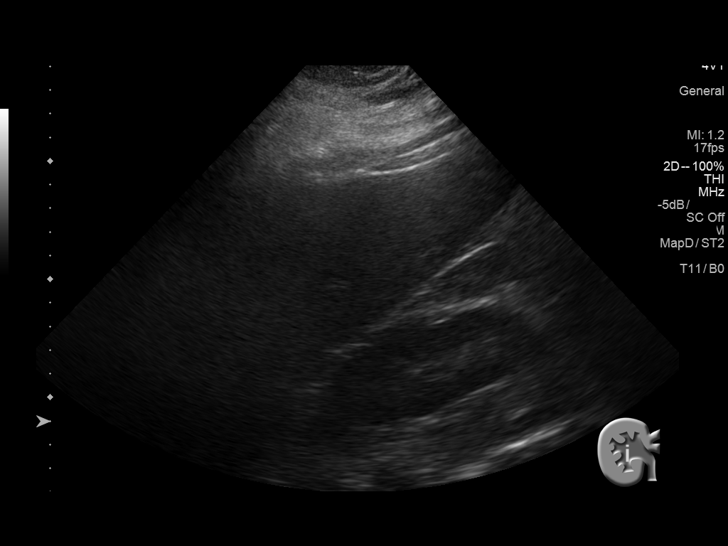
[im 51/77]
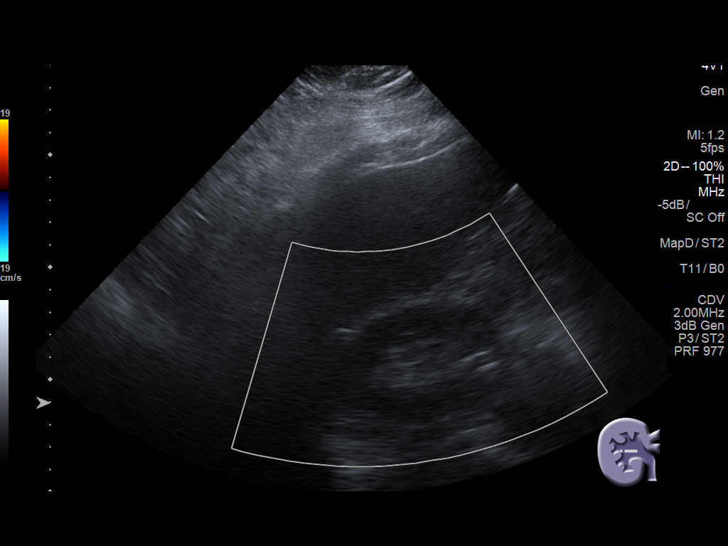
[im 58/77]
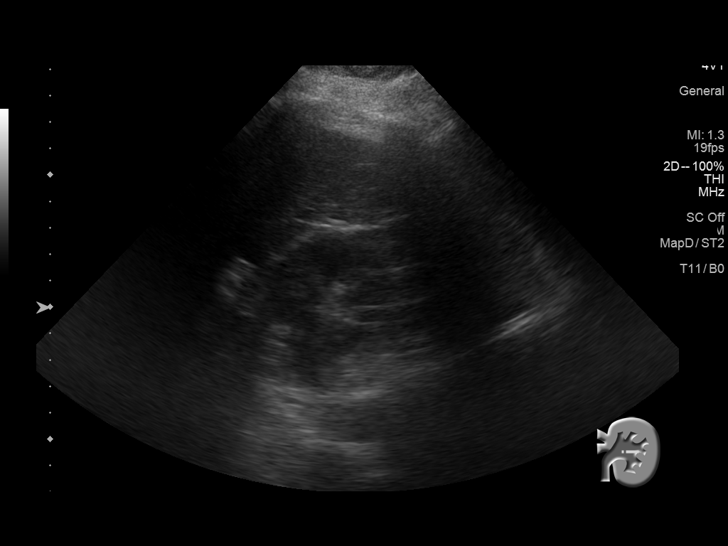
[im 64/77]
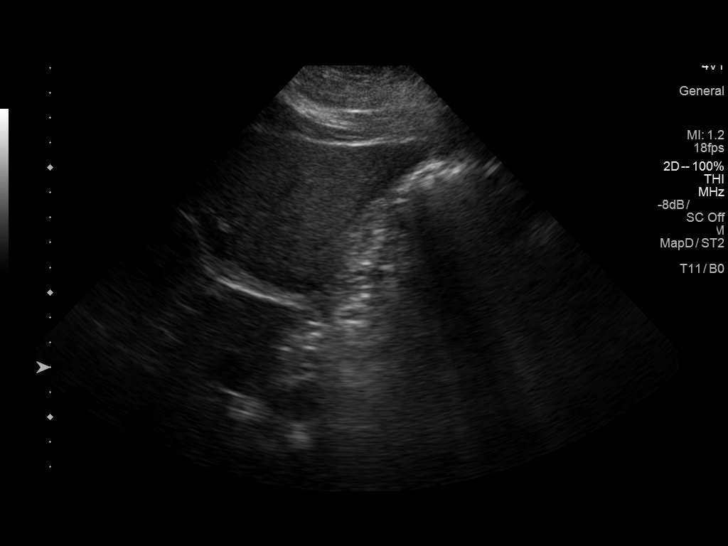
[im 70/77]
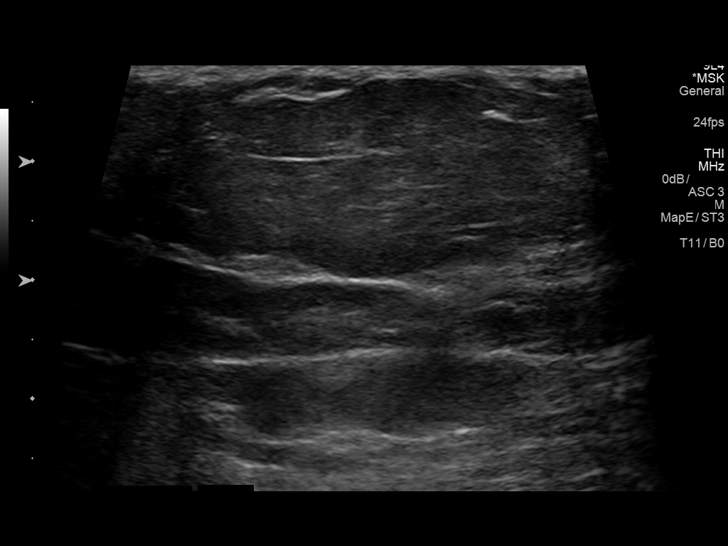
[im 77/77]
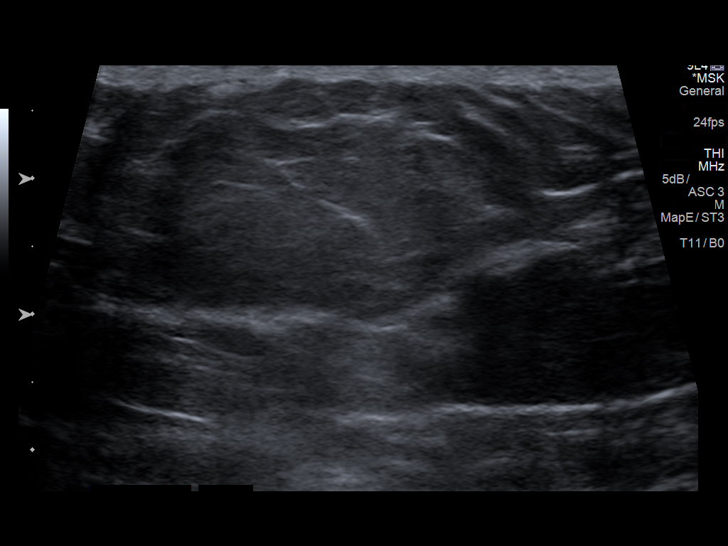

[14 of 25 positions shown; findings below may reference images not displayed]

FINDINGS: Gallbladder: Surgically absent

Common bile duct: Diameter: 3 mm diameter , normal

Liver: Upper normal echogenicity. No mass, hydronephrosis or
shadowing calcification identified though intrahepatic assessment is
slightly limited by body habitus. Hepatopetal portal venous flow.

IVC: Normal appearance

Pancreas: Poorly visualized due to bowel gas

Spleen: Normal appearance, 8.1 cm length

Right Kidney: Length: 13.0 cm. Normal morphology without mass or
hydronephrosis.

Left Kidney: Length: 12.7 cm. Normal morphology without mass or
hydronephrosis.

Abdominal aorta: Normal caliber

Other findings: No free fluid. Incidentally noted subcutaneous
nodule approximately 2.9 x 1.4 x 3.4 cm in size with echogenicity
and texture similar to that of subcutaneous fat. Lesion is most
consistent with a subcutaneous lipoma. This measured 2.2 x 1.2 x
cm on the prior exam.
IMPRESSION: Post cholecystectomy.

No definite upper abdominal sonographic abnormalities.

Probable subcutaneous lipoma RIGHT upper quadrant.

## 2020-03-28 ENCOUNTER — Ambulatory Visit: Payer: 59 | Admitting: Internal Medicine

## 2020-03-28 ENCOUNTER — Encounter: Payer: Self-pay | Admitting: Internal Medicine

## 2020-03-28 ENCOUNTER — Other Ambulatory Visit: Payer: Self-pay

## 2020-03-28 VITALS — BP 145/79 | HR 85 | Temp 96.6°F | Ht 66.0 in | Wt 293.6 lb

## 2020-03-28 DIAGNOSIS — R079 Chest pain, unspecified: Secondary | ICD-10-CM | POA: Diagnosis not present

## 2020-03-28 DIAGNOSIS — K219 Gastro-esophageal reflux disease without esophagitis: Secondary | ICD-10-CM

## 2020-03-28 MED ORDER — PANTOPRAZOLE SODIUM 40 MG PO TBEC: 40.0000 mg | DELAYED_RELEASE_TABLET | Freq: Two times a day (BID) | ORAL | 3 refills | Status: DC

## 2020-03-28 NOTE — Progress Notes (Signed)
Primary Care Physician:  Erasmo Downer, NP Primary Gastroenterologist:  Dr. Jena Gauss  Pre-Procedure History & Physical: HPI:  Leslie Mendez is a 44 y.o. female here for further evaluation of chest pain not felt to be cardiac in etiology.  Intermittent retrosternal chest pain sometimes rating to the left shoulder.  May be associated with eating a large meal.  No dysphagia.  Recent cardiac work-up including stress echo negative.  Started on omeprazole 20 mg daily a few weeks ago by PCP;  no improvement in symptoms.  Tiny hiatal hernia on 2013 EGD.  Status post cholecystectomy for stone disease years ago.  RUQ ultrasound negative.  Fatty liver.  Normal LFTs recently.  Normal H&H. She is gained 23 pounds by our scales since she was here a few years back. She will be due for high risk screening colonoscopy 2024 given family history of colon cancer in her father.  Past Medical History:  Diagnosis Date  . Fatty liver   . Hypercholesterolemia   . Migraine   . Polycystic ovaries     Past Surgical History:  Procedure Laterality Date  . CHOLECYSTECTOMY    . COLONOSCOPY  01/09/2012   anal canal hemorrhoids-likely source of trivial hematochezia  . COLONOSCOPY N/A 08/13/2017   Procedure: COLONOSCOPY;  Surgeon: Corbin Ade, MD;  Location: [facility];  Service: Endoscopy;  Laterality: N/A;  12:00pm  . ESOPHAGOGASTRODUODENOSCOPY  01/09/2012   Very small hiatal hernia; otherwise normal examination.  Marland Kitchen UTERINE SEPTUM RESECTION      Prior to Admission medications   Medication Sig Start Date End Date Taking? Authorizing Provider  omeprazole (PRILOSEC) 20 MG capsule Take 20 mg by mouth daily.   Yes [provider]    Allergies as of 03/28/2020 - Review Complete 03/28/2020  Allergen Reaction Noted  . Codeine Nausea And Vomiting 11/18/2010    Family History  Problem Relation Age of Onset  . Colon cancer Father 19       deceased at age 9    Social History   Socioeconomic  History  . Marital status: Married    Spouse name: Not on file  . Number of children: 0  . Years of education: Not on file  . Highest education level: Not on file  Occupational History  . Occupation: [employer]    Employer: [employer]    Comment: Nurse Health  Tobacco Use  . Smoking status: Never Smoker  . Smokeless tobacco: Never Used  Vaping Use  . Vaping Use: Never used  Substance and Sexual Activity  . Alcohol use: No  . Drug use: No  . Sexual activity: Yes    Birth control/protection: None  Other Topics Concern  . Not on file  Social History Narrative  . Not on file   Social Determinants of Health   Financial Resource Strain:   . Difficulty of Paying Living Expenses: Not on file  Food Insecurity:   . Worried About Programme researcher, broadcasting/film/video in the Last Year: Not on file  . Ran Out of Food in the Last Year: Not on file  Transportation Needs:   . Lack of Transportation (Medical): Not on file  . Lack of Transportation (Non-Medical): Not on file  Physical Activity:   . Days of Exercise per Week: Not on file  . Minutes of Exercise per Session: Not on file  Stress:   . Feeling of Stress : Not on file  Social Connections:   .  Frequency of Communication with Friends and Family: Not on file  . Frequency of Social Gatherings with Friends and Family: Not on file  . Attends Religious Services: Not on file  . Active Member of Clubs or Organizations: Not on file  . Attends Banker Meetings: Not on file  . Marital Status: Not on file  Intimate Partner Violence:   . Fear of Current or Ex-Partner: Not on file  . Emotionally Abused: Not on file  . Physically Abused: Not on file  . Sexually Abused: Not on file    Review of Systems: See HPI, otherwise negative ROS  Physical Exam: BP (!) 145/79   Pulse 85   Temp (!) 96.6 F (35.9 C)   Ht 5\' 6"  (1.676 m)   Wt 293 lb 9.6 oz (133.2 kg)   LMP 03/13/2020   BMI 47.39 kg/m  General:    Alert,  pleasant and cooperative in NAD Abdomen: Obese.  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  Fortunately, cardiac work-up negative.  Not much improvement in the way of omeprazole 20 mg daily.  No alarm features at this point  She has gained  a significant amount of weight since she was here previously.  This may have much to do with her symptoms as weight gain can contribute significantly to reflux.  Much much less likely would be an occult stone in her bile duct or other entity producing her symptoms.  Recommendations: No need for an EGD at this time.  Would like to see her on substantial acid suppression regimen as a short trial to see if this abolishes her chest symptoms.  We will stop omeprazole.  Begin protonix 40 mg twice daily (disp 60 with 3 refills); before breakfast and supper  GERD information  Weight loss of 15 pounds over the next 4 months  Regular aerobic exercise  Colonoscopy 2024  Office visit wit 2025 in 6 weeks    Notice: This dictation was prepared with Dragon dictation along with smaller phrase technology. Any transcriptional errors that result from this process are unintentional and may not be corrected upon review.

## 2020-03-28 NOTE — Patient Instructions (Signed)
Stop omeprazole.  Begin protonix 40 mg twice daily (disp 60 with 3 refills); before breakfast and supper  GERD information  Weight loss of 15 pounds over the next 4 months  Regular aerobic exercise  Colonoscopy 2024  Office visit wit [place] in 6 weeks

## 2020-04-18 ENCOUNTER — Telehealth: Payer: Self-pay | Admitting: Internal Medicine

## 2020-04-18 NOTE — Telephone Encounter (Signed)
Pt called saying the protonix was not helping and she was still having pain and a headache. Please advise. [phone]

## 2020-04-18 NOTE — Telephone Encounter (Signed)
Spoke with pt. Pt called with a progress report.  Pantoprazole 40 mg bid isn't working well. Pt has been taking it for 3 weeks. Pt's pain is in her upper abdominal/chest pain. Pt has been checked out by cardiology and the work up was ok. Pt also reports that she's had three migraines since she's been on Pantoprazole. Pt is taking Pantoprazole 30- 40 mins before her meals bid.

## 2020-04-19 NOTE — Telephone Encounter (Signed)
Spoke with pt. Pt will d/c Pantoprazole. Samples of Dexilant 60 mg once pill 30 mins before breakfast is ready for pickup.

## 2020-04-19 NOTE — Telephone Encounter (Signed)
Communication noted.  Stop Protonix. Give her a 3-week supply of Dexilant samples.  She is to take Dexilant 60 mg daily x3 weeks.  She should call us in 3 weeks to let us know how she is doing with that and we will go from there

## 2020-05-16 ENCOUNTER — Other Ambulatory Visit: Payer: Self-pay

## 2020-05-16 ENCOUNTER — Ambulatory Visit: Payer: BC Managed Care – PPO | Admitting: Internal Medicine

## 2020-05-16 ENCOUNTER — Encounter: Payer: Self-pay | Admitting: Internal Medicine

## 2020-05-16 VITALS — BP 152/79 | HR 78 | Temp 96.9°F | Ht 66.0 in | Wt 298.2 lb

## 2020-05-16 DIAGNOSIS — R079 Chest pain, unspecified: Secondary | ICD-10-CM

## 2020-05-16 DIAGNOSIS — K219 Gastro-esophageal reflux disease without esophagitis: Secondary | ICD-10-CM

## 2020-05-16 NOTE — Patient Instructions (Signed)
Continue Dexilant 60 mg daily (disp 30 with 11 refills)  Anti-reflux diet / GERD information provided-   Weight loss  -  30 pound goal in 1 year  OV in 6 months

## 2020-05-16 NOTE — Progress Notes (Signed)
Primary Care Physician:  Erasmo Downer, NP Primary Gastroenterologist:  Dr. Jena Gauss  Pre-Procedure History & Physical: HPI:  Leslie Mendez is a 45 y.o. female here for follow-up GERD/chest pain not felt to be cardiac in etiology.  Had intolerable headache with pantoprazole.  We switched to Dexilant 60 mg daily.  This has worked very well with amelioration of her chest pain/GERD symptoms.  We gave her samples.  Not having any dysphagia these days -although has had some vague intermittent esophageal dysphagia previously.  Hiatal hernia only on 2013 EGD. She has gained another 5 pounds through the holiday season.  She just adopted her fourth child (an infant).  Past Medical History:  Diagnosis Date  . Fatty liver   . Hypercholesterolemia   . Migraine   . Polycystic ovaries     Past Surgical History:  Procedure Laterality Date  . CHOLECYSTECTOMY    . COLONOSCOPY  01/09/2012   anal canal hemorrhoids-likely source of trivial hematochezia  . COLONOSCOPY N/A 08/13/2017   Procedure: COLONOSCOPY;  Surgeon: Corbin Ade, MD;  Location: [facility];  Service: Endoscopy;  Laterality: N/A;  12:00pm  . ESOPHAGOGASTRODUODENOSCOPY  01/09/2012   Very small hiatal hernia; otherwise normal examination.  Marland Kitchen UTERINE SEPTUM RESECTION      Prior to Admission medications   Medication Sig Start Date End Date Taking? Authorizing Provider  dexlansoprazole (DEXILANT) 60 MG capsule Take 60 mg by mouth daily.   Yes [provider]  simvastatin (ZOCOR) 20 MG tablet Take 1 tablet by mouth daily. 03/11/16  Yes [provider]    Allergies as of 05/16/2020 - Review Complete 05/16/2020  Allergen Reaction Noted  . Protonix [pantoprazole] Anaphylaxis 05/16/2020  . Codeine Nausea And Vomiting 11/18/2010    Family History  Problem Relation Age of Onset  . Colon cancer Father 61       deceased at age 23    Social History   Socioeconomic History  . Marital status: Married    Spouse  name: Not on file  . Number of children: 0  . Years of education: Not on file  . Highest education level: Not on file  Occupational History  . Occupation: [employer]    Employer: [employer]    Comment: Nurse Health  Tobacco Use  . Smoking status: Never Smoker  . Smokeless tobacco: Never Used  Vaping Use  . Vaping Use: Never used  Substance and Sexual Activity  . Alcohol use: No  . Drug use: No  . Sexual activity: Yes    Birth control/protection: None  Other Topics Concern  . Not on file  Social History Narrative  . Not on file   Social Determinants of Health   Financial Resource Strain: Not on file  Food Insecurity: Not on file  Transportation Needs: Not on file  Physical Activity: Not on file  Stress: Not on file  Social Connections: Not on file  Intimate Partner Violence: Not on file    Review of Systems: See HPI, otherwise negative ROS  Physical Exam: BP (!) 152/79   Pulse 78   Temp (!) 96.9 F (36.1 C) (Temporal)   Ht 5\' 6"  (1.676 m)   Wt 298 lb 3.2 oz (135.3 kg)   LMP 05/12/2020 (Exact Date)   BMI 48.13 kg/m  General:   Alert,  Well-developed, well-nourished, pleasant and cooperative in NAD  Impression/Plan: 45 year old lady with GERD manifested primarily as chest pain-now much better on Dexilant  60 mg daily.  No alarm features. We talked about the multipronged approach to treatment of GERD including weight loss. Positive family history colon cancer in her father at a young age; she will be due for screening colonoscopy 2024  Recommendations  Continue Dexilant 60 mg daily (disp 30 with 11 refills)  Anti-reflux diet / GERD information provided-   Weight loss  -  30 pound goal in 1 year  OV in 6 months    Notice: This dictation was prepared with Dragon dictation along with smaller phrase technology. Any transcriptional errors that result from this process are unintentional and may not be corrected upon review.

## 2020-05-24 ENCOUNTER — Other Ambulatory Visit: Payer: Self-pay

## 2020-05-24 ENCOUNTER — Telehealth: Payer: Self-pay | Admitting: Internal Medicine

## 2020-05-24 MED ORDER — DEXLANSOPRAZOLE 60 MG PO CPDR: 60.0000 mg | DELAYED_RELEASE_CAPSULE | Freq: Every day | ORAL | 11 refills | Status: DC

## 2020-05-24 NOTE — Telephone Encounter (Signed)
Medication Dexilant 60 mg one daily with 11 rfs was sent to pts pharmacy.

## 2020-05-24 NOTE — Telephone Encounter (Signed)
Pt said her rx of dexilant wasn't never sent to her pharmacy and she's been out since last Tuesday. She uses [facility] in [place]. [phone]

## 2020-10-31 ENCOUNTER — Encounter: Payer: Self-pay | Admitting: Internal Medicine

## 2021-05-28 ENCOUNTER — Other Ambulatory Visit: Payer: Self-pay | Admitting: Internal Medicine

## 2022-07-15 ENCOUNTER — Encounter: Payer: Self-pay | Admitting: *Deleted

## 2022-09-26 ENCOUNTER — Telehealth: Payer: Self-pay | Admitting: *Deleted

## 2022-09-26 NOTE — Telephone Encounter (Signed)
  Procedure: Colonoscopy  Height: 5'5 Weight: 290lbs       Have you had a colonoscopy before?  08/13/17, Dr. Jena Gauss  Do you have family history of colon cancer?  Yes father  Do you have a family history of polyps? yes  Previous colonoscopy with polyps removed? no  Do you have a history colorectal cancer?   no  Are you diabetic?  no  Do you have a prosthetic or mechanical heart valve? no  Do you have a pacemaker/defibrillator?   no  Have you had endocarditis/atrial fibrillation?  no  Do you use supplemental oxygen/CPAP?  no  Have you had joint replacement within the last 12 months?  no  Do you tend to be constipated or have to use laxatives?  no   Do you have history of alcohol use? If yes, how much and how often.  no  Do you have history or are you using drugs? If yes, what do are you  using?  no  Have you ever had a stroke/heart attack?  no  Have you ever had a heart or other vascular stent placed,?no  Do you take weight loss medication? no  female patients,: have you had a hysterectomy? no                              are you post menopausal?  no                              do you still have your menstrual cycle? yes    Date of last menstrual period? 08/29/22  Do you take any blood-thinning medications such as: (Plavix, aspirin, Coumadin, Aggrenox, Brilinta, Xarelto, Eliquis, Pradaxa, Savaysa or Effient)? no  If yes we need the name, milligram, dosage and who is prescribing doctor:               Listed not taking any medications    Allergies  Allergen Reactions   Protonix [Pantoprazole] Anaphylaxis    Made migraines worse   Codeine Nausea And Vomiting

## 2022-10-28 NOTE — Telephone Encounter (Signed)
Okay to schedule.  ASA 3 due to BMI.  Will need urine pregnancy test.

## 2022-10-28 NOTE — Telephone Encounter (Signed)
Will have to await Dr. Jena Gauss future schedule to schedule

## 2022-11-20 ENCOUNTER — Encounter: Payer: Self-pay | Admitting: *Deleted

## 2022-11-20 MED ORDER — NA SULFATE-K SULFATE-MG SULF 17.5-3.13-1.6 GM/177ML PO SOLN: ORAL | 0 refills | Status: AC

## 2022-11-20 NOTE — Telephone Encounter (Signed)
SPOKE WITH PT. She has been scheduled for 8/21. Aware will send instructions/pre-op. Rx for prep to be sent to [facility].

## 2022-11-20 NOTE — Addendum Note (Signed)
Addended by: Armstead Peaks on: 11/20/2022 09:44 AM   Modules accepted: Orders

## 2022-12-20 ENCOUNTER — Encounter (HOSPITAL_COMMUNITY)
Admission: RE | Admit: 2022-12-20 | Discharge: 2022-12-20 | Disposition: A | Discharge status: Home / Self Care | Payer: Managed Care, Other (non HMO) | Source: Ambulatory Visit | Attending: Internal Medicine | Admitting: Internal Medicine

## 2022-12-20 VITALS — Ht 66.0 in | Wt 298.3 lb

## 2022-12-20 DIAGNOSIS — R1011 Right upper quadrant pain: Secondary | ICD-10-CM

## 2022-12-25 ENCOUNTER — Ambulatory Visit (HOSPITAL_COMMUNITY): Payer: Managed Care, Other (non HMO) | Admitting: Certified Registered Nurse Anesthetist

## 2022-12-25 ENCOUNTER — Ambulatory Visit (HOSPITAL_BASED_OUTPATIENT_CLINIC_OR_DEPARTMENT_OTHER): Payer: Managed Care, Other (non HMO) | Admitting: Certified Registered Nurse Anesthetist

## 2022-12-25 ENCOUNTER — Encounter (HOSPITAL_COMMUNITY)
Admission: RE | Disposition: A | Discharge status: Home / Self Care | Payer: Self-pay | Source: Home / Self Care | Attending: Internal Medicine

## 2022-12-25 ENCOUNTER — Other Ambulatory Visit: Payer: Self-pay

## 2022-12-25 ENCOUNTER — Encounter (HOSPITAL_COMMUNITY): Payer: Self-pay | Admitting: Internal Medicine

## 2022-12-25 ENCOUNTER — Ambulatory Visit (HOSPITAL_COMMUNITY)
Admission: RE | Admit: 2022-12-25 | Discharge: 2022-12-25 | Disposition: A | Discharge status: Home / Self Care | Payer: Managed Care, Other (non HMO) | Attending: Internal Medicine | Admitting: Internal Medicine

## 2022-12-25 DIAGNOSIS — Z9049 Acquired absence of other specified parts of digestive tract: Secondary | ICD-10-CM | POA: Insufficient documentation

## 2022-12-25 DIAGNOSIS — Z800 Family history of malignant neoplasm of digestive organs: Secondary | ICD-10-CM | POA: Diagnosis not present

## 2022-12-25 DIAGNOSIS — Z1211 Encounter for screening for malignant neoplasm of colon: Secondary | ICD-10-CM | POA: Insufficient documentation

## 2022-12-25 DIAGNOSIS — R1011 Right upper quadrant pain: Secondary | ICD-10-CM

## 2022-12-25 DIAGNOSIS — Z86010 Personal history of colonic polyps: Secondary | ICD-10-CM

## 2022-12-25 HISTORY — PX: COLONOSCOPY WITH PROPOFOL: SHX5780

## 2022-12-25 LAB — POCT PREGNANCY, URINE: Preg Test, Ur: NEGATIVE

## 2022-12-25 SURGERY — COLONOSCOPY WITH PROPOFOL
Anesthesia: General

## 2022-12-25 MED ORDER — PROPOFOL 500 MG/50ML IV EMUL
INTRAVENOUS | Status: AC
  Filled 2022-12-25: qty 50

## 2022-12-25 MED ORDER — PROPOFOL 10 MG/ML IV BOLUS
INTRAVENOUS | Status: DC | PRN
  Administered 2022-12-25: 60 mg via INTRAVENOUS
  Administered 2022-12-25: 50 mg via INTRAVENOUS
  Administered 2022-12-25: 30 mg via INTRAVENOUS

## 2022-12-25 MED ORDER — PROPOFOL 500 MG/50ML IV EMUL
INTRAVENOUS | Status: DC | PRN
  Administered 2022-12-25: 150 ug/kg/min via INTRAVENOUS

## 2022-12-25 MED ORDER — LACTATED RINGERS IV SOLN: INTRAVENOUS | Status: DC | PRN

## 2022-12-25 NOTE — Transfer of Care (Signed)
Immediate Anesthesia Transfer of Care Note  Patient: Leslie Mendez  Procedure(s) Performed: COLONOSCOPY WITH PROPOFOL  Patient Location: Short Stay  Anesthesia Type:General  Level of Consciousness: awake, alert , and oriented  Airway & Oxygen Therapy: Patient Spontanous Breathing  Post-op Assessment: Report given to RN, Post -op Vital signs reviewed and stable, Patient moving all extremities X 4, and Patient able to stick tongue midline  Post vital signs: Reviewed and stable  Last Vitals:  Vitals Value Taken Time  BP 88/53 12/25/22 1030  Temp 97.8   Pulse 68 12/25/22 1031  Resp 16 12/25/22 1031  SpO2 99 % 12/25/22 1031  Vitals shown include unfiled device data.  Last Pain:  Vitals:   12/25/22 1009  TempSrc:   PainSc: 0-No pain      Patients Stated Pain Goal: 6 (12/25/22 0826)  Complications: No notable events documented.

## 2022-12-25 NOTE — Op Note (Signed)
[facility] Patient Name: Leslie Mendez Procedure Date: 12/25/2022 9:54 AM MRN: [id number] Date of Birth: [birth year] Attending MD: Gennette Pac , MD, [id number] CSN: [id number] Age: 47 Admit Type: Outpatient Procedure:                Colonoscopy Indications:              Screening for colorectal malignant neoplasm Providers:                Gennette Pac, MD, Crystal Page, Elinor Parkinson, Dyann Ruddle Referring MD:              Medicines:                Propofol per Anesthesia Complications:            No immediate complications. Estimated Blood Loss:     Estimated blood loss: none. Procedure:                Pre-Anesthesia Assessment:                           - Prior to the procedure, a History and Physical                            was performed, and patient medications and                            allergies were reviewed. The patient's tolerance of                            previous anesthesia was also reviewed. The risks                            and benefits of the procedure and the sedation                            options and risks were discussed with the patient.                            All questions were answered, and informed consent                            was obtained. Prior Anticoagulants: The patient has                            taken no anticoagulant or antiplatelet agents. ASA                            Grade Assessment: III - A patient with severe                            systemic disease. After reviewing the risks and  benefits, the patient was deemed in satisfactory                            condition to undergo the procedure.                           After obtaining informed consent, the colonoscope                            was passed under direct vision. Throughout the                            procedure, the patient's blood pressure, pulse, and                             oxygen saturations were monitored continuously. The                            [id number]) scope was introduced through the                            anus and advanced to the the cecum, identified by                            appendiceal orifice and ileocecal valve. The                            colonoscopy was performed without difficulty. The                            patient tolerated the procedure well. The quality                            of the bowel preparation was adequate. The                            ileocecal valve, appendiceal orifice, and rectum                            were photographed. The entire colon was well                            visualized. The colonoscopy was performed without                            difficulty. The patient tolerated the procedure                            well. The quality of the bowel preparation was                            adequate. The ileocecal valve, appendiceal orifice,  and rectum were photographed. Scope In: 10:13:42 AM Scope Out: 10:26:39 AM Scope Withdrawal Time: 0 hours 9 minutes 15 seconds  Total Procedure Duration: 0 hours 12 minutes 57 seconds  Findings:      The perianal and digital rectal examinations were normal.      The colon (entire examined portion) appeared normal.      The retroflexed view of the distal rectum and anal verge was normal and       showed no anal or rectal abnormalities. Impression:               - The entire examined colon is normal.                           - The distal rectum and anal verge are normal on                            retroflexion view.                           - No specimens collected. Moderate Sedation:      Moderate (conscious) sedation was personally administered by an       anesthesia professional. The following parameters were monitored: oxygen       saturation, heart rate, blood pressure, respiratory rate, EKG, adequacy       of  pulmonary ventilation, and response to care. Recommendation:           - Patient has a contact number available for                            emergencies. The signs and symptoms of potential                            delayed complications were discussed with the                            patient. Return to normal activities tomorrow.                            Written discharge instructions were provided to the                            patient.                           - Advance diet as tolerated.                           - Continue present medications.                           - Repeat colonoscopy in 5 years for screening                            purposes.                           - Return to GI office (date not  yet determined). Procedure Code(s):        --- Professional ---                           [phone], Colonoscopy, flexible; diagnostic, including                            collection of specimen(s) by brushing or washing,                            when performed (separate procedure) Diagnosis Code(s):        --- Professional ---                           Z12.11, Encounter for screening for malignant                            neoplasm of colon CPT copyright 2022 [employer] Association. All rights reserved. The codes documented in this report are preliminary and upon coder review may  be revised to meet current compliance requirements. Gerrit Friends. Dawanna Grauberger, MD Gennette Pac, MD 12/25/2022 10:34:42 AM This report has been signed electronically. Number of Addenda: 0

## 2022-12-25 NOTE — Anesthesia Preprocedure Evaluation (Signed)
Anesthesia Evaluation  Patient identified by MRN, date of birth, ID band Patient awake    Reviewed: Allergy & Precautions, H&P , NPO status , Patient's Chart, lab work & pertinent test results, reviewed documented beta blocker date and time   Airway Mallampati: II  TM Distance: >3 FB Neck ROM: full    Dental no notable dental hx.    Pulmonary neg pulmonary ROS   Pulmonary exam normal breath sounds clear to auscultation       Cardiovascular Exercise Tolerance: Good negative cardio ROS  Rhythm:regular Rate:Normal     Neuro/Psych  Headaches negative neurological ROS  negative psych ROS   GI/Hepatic negative GI ROS, Neg liver ROS,,,(+) Hepatitis -  Endo/Other  negative endocrine ROS    Renal/GU negative Renal ROS  negative genitourinary   Musculoskeletal   Abdominal   Peds  Hematology negative hematology ROS (+)   Anesthesia Other Findings   Reproductive/Obstetrics negative OB ROS                             Anesthesia Physical Anesthesia Plan  ASA: 2  Anesthesia Plan: General   Post-op Pain Management:    Induction:   PONV Risk Score and Plan: Propofol infusion  Airway Management Planned:   Additional Equipment:   Intra-op Plan:   Post-operative Plan:   Informed Consent: I have reviewed the patients History and Physical, chart, labs and discussed the procedure including the risks, benefits and alternatives for the proposed anesthesia with the patient or authorized representative who has indicated his/her understanding and acceptance.     Dental Advisory Given  Plan Discussed with: CRNA  Anesthesia Plan Comments:        Anesthesia Quick Evaluation

## 2022-12-25 NOTE — H&P (Signed)
@LOGO @   Primary Care Physician:  Erasmo Downer, NP Primary Gastroenterologist:  Dr. Jena Gauss  Pre-Procedure History & Physical: HPI:  Leslie Mendez is a 47 y.o. female here for   High risk screening colonoscopy.  Father died of colon cancer at age   53. Negative colonoscopy 5 years ago.  Past Medical History:  Diagnosis Date   Fatty liver    Hypercholesterolemia    Migraine    Polycystic ovaries     Past Surgical History:  Procedure Laterality Date   CHOLECYSTECTOMY     COLONOSCOPY  01/09/2012   anal canal hemorrhoids-likely source of trivial hematochezia   COLONOSCOPY N/A 08/13/2017   Procedure: COLONOSCOPY;  Surgeon: Corbin Ade, MD;  Location: [facility];  Service: Endoscopy;  Laterality: N/A;  12:00pm   ESOPHAGOGASTRODUODENOSCOPY  01/09/2012   Very small hiatal hernia; otherwise normal examination.   UTERINE SEPTUM RESECTION      Prior to Admission medications   Medication Sig Start Date End Date Taking? Authorizing Provider  Na Sulfate-K Sulfate-Mg Sulf 17.5-3.13-1.6 GM/177ML SOLN As directed 11/20/22   Valoria Tamburri, Gerrit Friends, MD    Allergies as of 11/20/2022 - Review Complete 05/16/2020  Allergen Reaction Noted   Protonix [pantoprazole] Anaphylaxis 05/16/2020   Codeine Nausea And Vomiting 11/18/2010    Family History  Problem Relation Age of Onset   Colon cancer Father 42       deceased at age 60    Social History   Socioeconomic History   Marital status: Married    Spouse name: Not on file   Number of children: 0   Years of education: Not on file   Highest education level: Not on file  Occupational History   Occupation: [employer]    Employer: [employer]    Comment: Nurse Health  Tobacco Use   Smoking status: Never   Smokeless tobacco: Never  Vaping Use   Vaping status: Never Used  Substance and Sexual Activity   Alcohol use: No   Drug use: No   Sexual activity: Yes    Birth control/protection: None  Other Topics  Concern   Not on file  Social History Narrative   Not on file   Social Determinants of Health   Financial Resource Strain: Not on file  Food Insecurity: Not on file  Transportation Needs: Not on file  Physical Activity: Not on file  Stress: Not on file  Social Connections: Not on file  Intimate Partner Violence: Not on file    Review of Systems: See HPI, otherwise negative ROS  Physical Exam: BP 125/69   Pulse 72   Temp 98.9 F (37.2 C) (Oral)   Resp (!) 9   Wt 122.5 kg   LMP 11/25/2022 (Approximate)   SpO2 100%   BMI 43.58 kg/m  General:   Alert,  Well-developed, well-nourished, pleasant and cooperative in NAD pathy. Lungs:  Clear throughout to auscultation.   No wheezes, crackles, or rhonchi. No acute distress. Heart:  Regular rate and rhythm; no murmurs, clicks, rubs,  or gallops. Abdomen: Non-distended, normal bowel sounds.  Soft and nontender without appreciable mass or hepatosplenomegaly.   Impression/Plan:    47 year old lady here for high risk screening colonoscopy.  Father succumbed to colorectal cancer at age 9. The risks, benefits, limitations, alternatives and imponderables have been reviewed with the patient. Questions have been answered. All parties are agreeable.       Notice: This dictation was prepared with Dragon dictation  along with smaller phrase technology. Any transcriptional errors that result from this process are unintentional and may not be corrected upon review.

## 2022-12-25 NOTE — Discharge Instructions (Signed)
  Colonoscopy Discharge Instructions  Read the instructions outlined below and refer to this sheet in the next few weeks. These discharge instructions provide you with general information on caring for yourself after you leave the hospital. Your doctor may also give you specific instructions. While your treatment has been planned according to the most current medical practices available, unavoidable complications occasionally occur. If you have any problems or questions after discharge, call Dr. Jena Gauss at [phone]. ACTIVITY You may resume your regular activity, but move at a slower pace for the next 24 hours.  Take frequent rest periods for the next 24 hours.  Walking will help get rid of the air and reduce the bloated feeling in your belly (abdomen).  No driving for 24 hours (because of the medicine (anesthesia) used during the test).   Do not sign any important legal documents or operate any machinery for 24 hours (because of the anesthesia used during the test).  NUTRITION Drink plenty of fluids.  You may resume your normal diet as instructed by your doctor.  Begin with a light meal and progress to your normal diet. Heavy or fried foods are harder to digest and may make you feel sick to your stomach (nauseated).  Avoid alcoholic beverages for 24 hours or as instructed.  MEDICATIONS You may resume your normal medications unless your doctor tells you otherwise.  WHAT YOU CAN EXPECT TODAY Some feelings of bloating in the abdomen.  Passage of more gas than usual.  Spotting of blood in your stool or on the toilet paper.  IF YOU HAD POLYPS REMOVED DURING THE COLONOSCOPY: No aspirin products for 7 days or as instructed.  No alcohol for 7 days or as instructed.  Eat a soft diet for the next 24 hours.  FINDING OUT THE RESULTS OF YOUR TEST Not all test results are available during your visit. If your test results are not back during the visit, make an appointment with your caregiver to find out the  results. Do not assume everything is normal if you have not heard from your caregiver or the medical facility. It is important for you to follow up on all of your test results.  SEEK IMMEDIATE MEDICAL ATTENTION IF: You have more than a spotting of blood in your stool.  Your belly is swollen (abdominal distention).  You are nauseated or vomiting.  You have a temperature over 101.  You have abdominal pain or discomfort that is severe or gets worse throughout the day.       Your colonoscopy was normal today  I recommend a repeat screening colonoscopy in 5 years   at patient request, I called Lum Keas at [phone] findings and recommendations

## 2022-12-26 NOTE — Anesthesia Postprocedure Evaluation (Signed)
Anesthesia Post Note  Patient: ELLYN SHUMAKER  Procedure(s) Performed: COLONOSCOPY WITH PROPOFOL  Patient location during evaluation: Phase II Anesthesia Type: General Level of consciousness: awake Pain management: pain level controlled Vital Signs Assessment: post-procedure vital signs reviewed and stable Respiratory status: spontaneous breathing and respiratory function stable Cardiovascular status: blood pressure returned to baseline and stable Postop Assessment: no headache and no apparent nausea or vomiting Anesthetic complications: no Comments: Late entry   No notable events documented.   Last Vitals:  Vitals:   12/25/22 0834 12/25/22 1030  BP: 125/69 (!) 88/53  Pulse: 72 69  Resp: (!) 9 10  Temp: 37.2 C 36.6 C  SpO2: 100% 98%    Last Pain:  Vitals:   12/25/22 1030  TempSrc: Oral  PainSc: 0-No pain                 Windell Norfolk

## 2022-12-27 ENCOUNTER — Encounter (HOSPITAL_COMMUNITY): Payer: Self-pay | Admitting: Internal Medicine

## 2024-02-25 ENCOUNTER — Other Ambulatory Visit: Payer: Self-pay | Admitting: Nurse Practitioner

## 2024-02-25 DIAGNOSIS — Z136 Encounter for screening for cardiovascular disorders: Secondary | ICD-10-CM

## 2024-03-04 ENCOUNTER — Ambulatory Visit
Admission: RE | Admit: 2024-03-04 | Discharge: 2024-03-04 | Disposition: A | Discharge status: Home / Self Care | Payer: Self-pay | Source: Ambulatory Visit | Attending: Nurse Practitioner | Admitting: Nurse Practitioner

## 2024-03-04 DIAGNOSIS — Z136 Encounter for screening for cardiovascular disorders: Secondary | ICD-10-CM | POA: Insufficient documentation
